# Patient Record
Sex: Female | Born: 1965 | Race: White | Hispanic: No | Smoking: Never smoker
Health system: Southern US, Community
[De-identification: ages and names within clinical notes are randomized; demographics above are authoritative.]

## PROBLEM LIST (undated history)

## (undated) DIAGNOSIS — F419 Anxiety disorder, unspecified: Secondary | ICD-10-CM

## (undated) DIAGNOSIS — N809 Endometriosis, unspecified: Secondary | ICD-10-CM

## (undated) DIAGNOSIS — H409 Unspecified glaucoma: Secondary | ICD-10-CM

## (undated) DIAGNOSIS — M2550 Pain in unspecified joint: Secondary | ICD-10-CM

## (undated) DIAGNOSIS — D649 Anemia, unspecified: Secondary | ICD-10-CM

## (undated) DIAGNOSIS — E785 Hyperlipidemia, unspecified: Secondary | ICD-10-CM

## (undated) DIAGNOSIS — R0602 Shortness of breath: Secondary | ICD-10-CM

## (undated) DIAGNOSIS — M549 Dorsalgia, unspecified: Secondary | ICD-10-CM

## (undated) DIAGNOSIS — I10 Essential (primary) hypertension: Secondary | ICD-10-CM

## (undated) DIAGNOSIS — N946 Dysmenorrhea, unspecified: Secondary | ICD-10-CM

## (undated) DIAGNOSIS — R7303 Prediabetes: Secondary | ICD-10-CM

## (undated) DIAGNOSIS — F32A Depression, unspecified: Secondary | ICD-10-CM

## (undated) DIAGNOSIS — K219 Gastro-esophageal reflux disease without esophagitis: Secondary | ICD-10-CM

## (undated) DIAGNOSIS — F329 Major depressive disorder, single episode, unspecified: Secondary | ICD-10-CM

## (undated) DIAGNOSIS — K5900 Constipation, unspecified: Secondary | ICD-10-CM

## (undated) DIAGNOSIS — Z789 Other specified health status: Secondary | ICD-10-CM

## (undated) DIAGNOSIS — G4730 Sleep apnea, unspecified: Secondary | ICD-10-CM

## (undated) DIAGNOSIS — T7840XA Allergy, unspecified, initial encounter: Secondary | ICD-10-CM

## (undated) HISTORY — DX: Hyperlipidemia, unspecified: E78.5

## (undated) HISTORY — DX: Other specified health status: Z78.9

## (undated) HISTORY — DX: Depression, unspecified: F32.A

## (undated) HISTORY — DX: Dysmenorrhea, unspecified: N94.6

## (undated) HISTORY — DX: Prediabetes: R73.03

## (undated) HISTORY — DX: Essential (primary) hypertension: I10

## (undated) HISTORY — DX: Endometriosis, unspecified: N80.9

## (undated) HISTORY — PX: POLYPECTOMY: SHX149

## (undated) HISTORY — DX: Pain in unspecified joint: M25.50

## (undated) HISTORY — DX: Major depressive disorder, single episode, unspecified: F32.9

## (undated) HISTORY — DX: Anemia, unspecified: D64.9

## (undated) HISTORY — DX: Unspecified glaucoma: H40.9

## (undated) HISTORY — PX: DENTAL SURGERY: SHX609

## (undated) HISTORY — DX: Sleep apnea, unspecified: G47.30

## (undated) HISTORY — DX: Anxiety disorder, unspecified: F41.9

## (undated) HISTORY — DX: Dorsalgia, unspecified: M54.9

## (undated) HISTORY — DX: Allergy, unspecified, initial encounter: T78.40XA

## (undated) HISTORY — DX: Constipation, unspecified: K59.00

## (undated) HISTORY — PX: DILATION AND CURETTAGE OF UTERUS: SHX78

## (undated) HISTORY — PX: COLONOSCOPY: SHX174

---

## 1999-09-25 ENCOUNTER — Other Ambulatory Visit: Admission: RE | Admit: 1999-09-25 | Discharge: 1999-09-25 | Payer: Self-pay | Admitting: Family Medicine

## 2004-03-23 ENCOUNTER — Ambulatory Visit (HOSPITAL_COMMUNITY): Admission: RE | Admit: 2004-03-23 | Discharge: 2004-03-23 | Payer: Self-pay | Admitting: Family Medicine

## 2004-04-03 ENCOUNTER — Other Ambulatory Visit: Admission: RE | Admit: 2004-04-03 | Discharge: 2004-04-03 | Payer: Self-pay | Admitting: Family Medicine

## 2006-02-12 ENCOUNTER — Other Ambulatory Visit: Admission: RE | Admit: 2006-02-12 | Discharge: 2006-02-12 | Payer: Self-pay | Admitting: Gynecology

## 2007-12-18 ENCOUNTER — Other Ambulatory Visit: Admission: RE | Admit: 2007-12-18 | Discharge: 2007-12-18 | Payer: Self-pay | Admitting: Obstetrics and Gynecology

## 2008-02-15 ENCOUNTER — Encounter: Payer: Self-pay | Admitting: Obstetrics and Gynecology

## 2008-02-15 ENCOUNTER — Ambulatory Visit (HOSPITAL_BASED_OUTPATIENT_CLINIC_OR_DEPARTMENT_OTHER): Admission: RE | Admit: 2008-02-15 | Discharge: 2008-02-15 | Payer: Self-pay | Admitting: Obstetrics and Gynecology

## 2010-11-13 NOTE — Op Note (Signed)
NAME:  Ariana Juarez, Ariana Juarez                 ACCOUNT NO.:  [id number]   MEDICAL RECORD NO.:  [id number]          PATIENT TYPE:  AMB   LOCATION:  [facility]                         FACILITY:  [facility]   PHYSICIAN:  Cynthia P. Romine, M.D.DATE OF BIRTH:  1966-01-16   DATE OF PROCEDURE:  02/15/2008  DATE OF DISCHARGE:                               OPERATIVE REPORT   PREOPERATIVE DIAGNOSIS:  Metrorrhagia and 12 mm fundal polyp.   POSTOPERATIVE DIAGNOSIS:  Metrorrhagia and 12 mm fundal polyp, path  pending.   PROCEDURE:  Hysteroscopic resection of polyp and D&C   SURGEON:  Cynthia P. Romine, M.D.   ANESTHESIA:  General by LMA.   ESTIMATED BLOOD LOSS:  Minimal.   SORBITOL DEFICIT:  35 mL.   COMPLICATIONS:  None.   PROCEDURE:  The patient was taken to the operating room and after the  induction of adequate general anesthesia, was placed in dorsal lithotomy  position and prepped and draped in the usual fashion.  Bladder was  drained with a red rubber catheter posterior weighted and anterior Sims  retractors were placed.  The cervix was grasped on its anterior lip with  a single-tooth tenaculum.  Paracervical block was instituted using 10 mL  of 1% Xylocaine in each of 3 and 9 o'clock.  The uterus sounded to 9 cm.  The cervix was dilated to a #31 Pratt.  The operative hysteroscope was  introduced.  Sorbitol was used as a distention medium.  The pump was set  on 70 mmHg initially and about half way through the case, it was  increased to 80 mmHg.  The hysteroscope was introduced.  There was a 1  cm sessile endometrial polyp in the fundus near the right cornu that was  resected with the single loop cautery.  There were some other areas of  polyp-like structures in the endometrium that were removed with a loop.  The hysteroscope was then withdrawn.  Gentle sharp curettage was then  done.  Specimens were sent as one to pathology.  The instruments were  removed from vagina and the procedure was terminated.   The patient  tolerated it well, in satisfactory condition to post anesthesia  recovery.      Cynthia P. Romine, M.D.  Electronically Signed     CPR/MEDQ  D:  02/15/2008  T:  02/15/2008  Job:  [id number]

## 2011-03-29 LAB — BASIC METABOLIC PANEL
BUN: 10
CO2: 28
Calcium: 9.3
Chloride: 97
Creatinine, Ser: 0.71
GFR calc Af Amer: >60
GFR calc non Af Amer: >60
Glucose, Bld: 90
Potassium: 3.5
Sodium: 136

## 2011-03-29 LAB — CBC
HCT: 42
RBC: 4.91
RDW: 14.4
WBC: 12.8 — ABNORMAL HIGH

## 2011-03-29 LAB — HCG, SERUM, QUALITATIVE: Preg, Serum: NEGATIVE

## 2011-10-22 ENCOUNTER — Ambulatory Visit: Payer: Self-pay | Admitting: Family Medicine

## 2011-10-22 VITALS — BP 123/72 | HR 80 | Temp 98.3°F | Resp 16 | Ht 64.0 in | Wt 200.6 lb

## 2011-10-22 DIAGNOSIS — R1032 Left lower quadrant pain: Secondary | ICD-10-CM

## 2011-10-22 LAB — POCT CBC
Hemoglobin: 10.6 g/dL — AB (ref 12.2–16.2)
Lymph, poc: 3.4 (ref 0.6–3.4)
MCV: 73.9 fL — AB (ref 80–97)
MID (cbc): 0.9 (ref 0–0.9)
POC Granulocyte: 9.6 — AB (ref 2–6.9)
POC LYMPH PERCENT: 24.6 %L (ref 10–50)
Platelet Count, POC: 438 10*3/uL — AB (ref 142–424)
RBC: 4.62 M/uL (ref 4.04–5.48)
RDW, POC: 17.7 %
WBC: 13.9 10*3/uL — AB (ref 4.6–10.2)

## 2011-10-22 LAB — BASIC METABOLIC PANEL
BUN: 11 mg/dL (ref 6–23)
CO2: 26 mEq/L (ref 19–32)
Glucose, Bld: 74 mg/dL (ref 70–99)
Potassium: 3.5 mEq/L (ref 3.5–5.3)

## 2011-10-22 MED ORDER — MELOXICAM 7.5 MG PO TABS: ORAL_TABLET | ORAL | Status: DC

## 2011-10-22 NOTE — Progress Notes (Signed)
Patient Name: Ariana Juarez Date of Birth: Sep 15, 1965 Medical Record Number: [id number] Gender: female Date of Encounter: 10/22/2011  History of Present Illness:  Ariana Juarez is a 46 y.o. very pleasant female patient who presents with the following:  Here to discuss possible ovarian cysts.  She notes that she has a Heeter history of "bad cysts" once or twice a year.  However for about the last year she has noted pain in her LLQ about once a month.  She also notes that she "feels faint" when she has her menses every month and "basically just has to stay in bed."  She currently states that she has pain "from my uterus to my tailbone."   She has noted pain this time since Friday- today is Tuesday.  Has been taking some of her brother's pain medication.  She notes that she has had to leave work and "just stay in bed" with these symptoms and also with her periods.  Her menses occur about every 3 1/2 weeks.    Bridgitt states that she last saw her OB and had an ultrasound for this condition about 3 years ago.  She also states that she has taken pills "which are like a D and C but not as strong" for this in the past (?)  She has not had any fever, dysuria, nausea or vomiting.   She has not been SA since "the 1980's" and states there is absolutely no chance of pregnancy.  LMP 10/09/11.  No vaginal discharge  Upon review of Ariana Juarez's chart it appears that she has been evaluated for this problem a few times- a note from 2005 describe problems with LLQ pain for 3 years.   There is no problem list on file for this patient.  No past medical history on file. No past surgical history on file. History  Substance Use Topics  . Smoking status: Never Smoker   . Smokeless tobacco: Not on file  . Alcohol Use: Not on file   No family history on file. No Known Allergies  Medication list has been reviewed and updated.  Review of Systems: As per HPI- otherwise negative. Difficult historian   Physical  Examination: Filed Vitals:   10/22/11 1247  BP: 123/72  Pulse: 80  Temp: 98.3 F (36.8 C)  TempSrc: Oral  Resp: 16  Height: 5\' 4"  (1.626 m)  Weight: 200 lb 9.6 oz (90.992 kg)    Body mass index is 34.43 kg/(m^2).  As per HPI- otherwise negative. "I'm afraid there is infection through my body because I have a tooth infection."   Assessment and Plan: GEN: WDWN, NAD, Non-toxic, A & O x 3, obese HEENT: Atraumatic, Normocephalic. Neck supple. No masses, No LAD. Ears and Nose: No external deformity. CV: RRR, No M/G/R. No JVD. No thrill. No extra heart sounds. PULM: CTA B, no wheezes, crackles, rhonchi. No retractions. No resp. distress. No accessory muscle use. ABD: S, NT, ND, +BS. No rebound. No HSM. Gu: normal external genitals, normal vagina and cervix/ no CMT.  Negative adnexa EXTR: No c/c/e NEURO Normal gait.  PSYCH: Normally interactive. Conversant. Not depressed or anxious appearing.  Calm demeanor.   Results for orders placed in visit on 10/22/11  POCT CBC      Component Value Range   WBC 13.9 (*) 4.6 - 10.2 (K/uL)   Lymph, poc 3.4  0.6 - 3.4    POC LYMPH PERCENT 24.6  10 - 50 (%L)   MID (cbc) 0.9  0 - 0.9  POC MID % 6.4  0 - 12 (%M)   POC Granulocyte 9.6 (*) 2 - 6.9    Granulocyte percent 69.0  37 - 80 (%G)   RBC 4.62  4.04 - 5.48 (M/uL)   Hemoglobin 10.6 (*) 12.2 - 16.2 (g/dL)   HCT, POC 16.1 (*) 09.6 - 47.9 (%)   MCV 73.9 (*) 80 - 97 (fL)   MCH, POC 22.9 (*) 27 - 31.2 (pg)   MCHC 31.1 (*) 31.8 - 35.4 (g/dL)   RDW, POC 04.5     Platelet Count, POC 438 (*) 142 - 424 (K/uL)   MPV 9.6  0 - 99.8 (fL)   Assessment/ plan 1. Abdominal pain, left lower quadrant  Pap IG (Image Guided), POCT CBC, Basic metabolic panel, meloxicam (MOBIC) 7.5 MG tablet   Xavier has had problems with LLQ pain/ tenderness off an on for several years which has been attributed to ovarian cysts.    She currently does not have insurance and does not want to go for an ultrasound/ OBG  consultation.  At this time her exam is benign.  She also requested a pap and BMP today so these were performed.  I was concerned about her elevated wbc count, but on review of her chart it seems that her WBC count is usually at least 12k, sometimes 13k without any known cause.  This has been the case for several years.  Therefore, do not think we urgently need to do a CT or other invasive evaluation.    I did encourage her to start an OTC iron supplement as she likely has microcytic anemia.  She had not wanted to do this because her "mother told me that iron makes cancer grow."  I let her know that this is not true and that iron might help improve her energy level.  For the time being will await her other labs and follow- up closely.  If she gets worse or her symptoms change in the meantime please call or RTC/ ED. Mobic prn

## 2011-10-24 LAB — PAP IG (IMAGE GUIDED)

## 2011-10-28 ENCOUNTER — Encounter: Payer: Self-pay | Admitting: Family Medicine

## 2011-10-29 ENCOUNTER — Telehealth: Payer: Self-pay

## 2011-10-29 NOTE — Telephone Encounter (Signed)
Pt CB and LM on my VM w/status update for Dr Patsy Lager who had called and LM to check on pt. Pt states that she is "doing OK so far" and that she has been able to take the Meloxicm w/no SEs so far. She thanked Dr Patsy Lager for checking on her.

## 2011-11-04 ENCOUNTER — Encounter: Payer: Self-pay | Admitting: Family Medicine

## 2011-11-06 ENCOUNTER — Telehealth: Payer: Self-pay

## 2011-11-06 NOTE — Telephone Encounter (Addendum)
Pt is having a lot of pain with her menstrual cycle would like a nurse to call her regarding this

## 2011-11-07 NOTE — Telephone Encounter (Signed)
Pain has subsided.  She took an oxycontin and 1/2 of an hydrocodone.  She asked if she should go to er to get morphine.  I told her she would probably need to see her gynecologist.

## 2011-12-19 ENCOUNTER — Ambulatory Visit: Payer: Self-pay | Admitting: Physician Assistant

## 2011-12-19 VITALS — BP 141/82 | HR 109 | Temp 98.7°F | Resp 18 | Ht 64.0 in | Wt 204.2 lb

## 2011-12-19 DIAGNOSIS — I10 Essential (primary) hypertension: Secondary | ICD-10-CM

## 2011-12-19 DIAGNOSIS — E7800 Pure hypercholesterolemia, unspecified: Secondary | ICD-10-CM

## 2011-12-19 DIAGNOSIS — J209 Acute bronchitis, unspecified: Secondary | ICD-10-CM

## 2011-12-19 MED ORDER — PREDNISONE 20 MG PO TABS: ORAL_TABLET | ORAL | Status: DC

## 2011-12-19 MED ORDER — BENZONATATE 100 MG PO CAPS: 100.0000 mg | ORAL_CAPSULE | Freq: Three times a day (TID) | ORAL | Status: AC | PRN

## 2011-12-19 MED ORDER — AZITHROMYCIN 500 MG PO TABS: 500.0000 mg | ORAL_TABLET | Freq: Every day | ORAL | Status: AC

## 2011-12-19 NOTE — Progress Notes (Signed)
  Subjective:    Patient ID: Ariana Juarez, female    DOB: 28-Aug-1965, 46 y.o.   MRN: [id number]  HPI 46yo CF presents with 1 week h/o URI that has progressively worsened over the last day or two.  No f/c.  +cough. +sinus congestion.  She still has dulera at home but not using it.    Reviewed 09/2011 labs and glucose was normal  Review of Systems  All other systems reviewed and are negative.       Objective:   Physical Exam  Nursing note and vitals reviewed. Constitutional: She is oriented to person, place, and time. She appears well-developed and well-nourished.  HENT:  Head: Normocephalic and atraumatic.  Mouth/Throat: Oropharynx is clear and moist. No oropharyngeal exudate (PND without erythema).       B TM with fluid present, but no infection.  Turbinates inflamed and swollen.  Neck: Normal range of motion. Neck supple.  Cardiovascular: Normal rate, regular rhythm and normal heart sounds.   Pulmonary/Chest: Effort normal. No respiratory distress. She has wheezes (mild wheezes present B at end expiration.). She has no rales. She exhibits no tenderness.  Lymphadenopathy:    She has no cervical adenopathy.  Neurological: She is alert and oriented to person, place, and time.  Skin: Skin is warm and dry.          Assessment & Plan:  Asthmatic bronchitis-see Rxs.  Restart Dulera.

## 2011-12-19 NOTE — Patient Instructions (Addendum)
Fluids and rest.  Use dulera inhaler as directed previously for the next 2 weeks.

## 2012-01-06 ENCOUNTER — Ambulatory Visit: Payer: Self-pay | Admitting: Family Medicine

## 2012-01-06 ENCOUNTER — Ambulatory Visit: Payer: Self-pay

## 2012-01-06 ENCOUNTER — Encounter: Payer: Self-pay | Admitting: Family Medicine

## 2012-01-06 VITALS — BP 118/90 | HR 107 | Temp 98.3°F | Resp 20 | Ht 64.0 in | Wt 206.8 lb

## 2012-01-06 DIAGNOSIS — R059 Cough, unspecified: Secondary | ICD-10-CM

## 2012-01-06 DIAGNOSIS — R05 Cough: Secondary | ICD-10-CM

## 2012-01-06 NOTE — Progress Notes (Signed)
Patient Name: Ariana Juarez Date of Birth: [birth year] Medical Record Number: [id number] Gender: female Date of Encounter: 01/06/2012  Chief Complaint: Cough   History of Present Illness:  Ariana Juarez is a 46 y.o. very pleasant female patient who presents with the following:  She was here on 12/19/11 and was started on a z-pack and prednisone for asthmatic bronchitis. She finished these medications and felt a little better.  However, she continues to have a cough which is "deep in my chest."   She has not noted a fever.  She does have some hot flashes.  She is still using her dulera (she has this from a previous episode of pneumonia).   LMP 12/23/11- she is not SA.    Ariana Juarez feels that "something is wearing me down" and causing her to become ill frequently.  She is taking her iron sometimes (see CBC here 10/22/11).    Patient Active Problem List  Diagnosis  . HTN (hypertension)  . Hypercholesterolemia   No past medical history on file. No past surgical history on file. History  Substance Use Topics  . Smoking status: Never Smoker   . Smokeless tobacco: Not on file  . Alcohol Use: Not on file   No family history on file. Allergies  Allergen Reactions  . Mobic (Meloxicam)     Patient states she "felt her heart beat" while on med    Medication list has been reviewed and updated.  Current Outpatient Prescriptions on File Prior to Visit  Medication Sig Dispense Refill  . lisinopril-hydrochlorothiazide (PRINZIDE,ZESTORETIC) 20-25 MG per tablet Take 1 tablet by mouth daily.      . mometasone-formoterol (DULERA) 100-5 MCG/ACT AERO Inhale 2 puffs into the lungs 2 (two) times daily.      . pravastatin (PRAVACHOL) 40 MG tablet Take 40 mg by mouth daily.      . sertraline (ZOLOFT) 50 MG tablet Take 50 mg by mouth daily.       . predniSONE (DELTASONE) 20 MG tablet 3,3,3,2,2,2,1,1,1 Take each days dose in am with food  18 tablet  0   Review of Systems:  As per HPI- otherwise  negative. Feels tired  Physical Examination: Filed Vitals:   01/06/12 1653  BP: 160/90  Pulse: 107  Temp: 98.3 F (36.8 C)  Resp: 20   Filed Vitals:   01/06/12 1653  Height: 5\' 4"  (1.626 m)  Weight: 206 lb 12.8 oz (93.804 kg)   Body mass index is 35.50 kg/(m^2). Ideal Body Weight: Weight in (lb) to have BMI = 25: 145.3   GEN: WDWN, NAD, Non-toxic, A & O x 3, obese HEENT: Atraumatic, Normocephalic. Neck supple. No masses, No LAD.  TM and oropharynx wnl.  PEERL, EOMI Ears and Nose: No external deformity. CV: RRR, No M/G/R. No JVD. No thrill. No extra heart sounds. PULM: CTA B, no wheezes, crackles, rhonchi. No retractions. No resp. distress. No accessory muscle use. ABD: S, NT, ND, +BS. No rebound. No HSM. EXTR: No c/c/e NEURO Normal gait.  PSYCH: Normally interactive. Conversant. Not depressed or anxious appearing.  Calm demeanor.   [facility] reading (PRIMARY) by  Dr. Patsy Lager. Negative chest  CHEST - 2 VIEW  Comparison: None.  Findings: The lungs are clear. There is some peribronchial thickening which may indicate bronchitis. The heart is within normal limits in size. No bony abnormality is seen.  IMPRESSION: No pneumonia. Peribronchial thickening may indicate bronchitis.  Assessment and Plan: 1. Cough  DG Chest 2 View   Persistent cough after  a recent illness.  However, it seems that she is feeling better now.  Will continue conservative treatment.  Also encouraged her to start exercising/ eating healthfully with a goal of losing some weight and getting into better shape.  She declined to recheck her CBC today- I asked her to please come in within the next couple of weeks so we can recheck her CBC and see how she is doing with her anemia.   Received radiology note- possible bronchitis.  Will call tomorrow and offer doxycycline- however she was recently treated with azithromycin so if overall better may not need more abx.   Abbe Amsterdam, MD

## 2012-01-07 ENCOUNTER — Other Ambulatory Visit: Payer: Self-pay | Admitting: Family Medicine

## 2012-01-07 DIAGNOSIS — J40 Bronchitis, not specified as acute or chronic: Secondary | ICD-10-CM

## 2012-01-07 MED ORDER — DOXYCYCLINE HYCLATE 100 MG PO TABS: 100.0000 mg | ORAL_TABLET | Freq: Two times a day (BID) | ORAL | Status: AC

## 2012-01-07 NOTE — Progress Notes (Signed)
Called and LMOM- reviewed her chest XR report from yesterday.  She may have bronchitis. I will send in rx for doxycycline which she can use if she likes.  However, she can also see if she does not improve on her own in the next few days.  I will call and check on her soon

## 2012-01-09 ENCOUNTER — Telehealth: Payer: Self-pay | Admitting: Family Medicine

## 2012-01-09 NOTE — Telephone Encounter (Signed)
Still no answer- LMOM

## 2012-01-10 ENCOUNTER — Encounter: Payer: Self-pay | Admitting: Family Medicine

## 2012-01-10 ENCOUNTER — Telehealth: Payer: Self-pay | Admitting: Family Medicine

## 2012-01-10 NOTE — Telephone Encounter (Signed)
LMOM again- this is our third or fourth message.  LM saying that if she needs anything please call, but otherwise I will assume that she is feeling better.  Will mail her a letter also with copy of x-ray results.

## 2012-01-13 ENCOUNTER — Telehealth: Payer: Self-pay

## 2012-01-13 NOTE — Telephone Encounter (Signed)
Pt is on day 5 of antibiotics and is concerned she does not feel better. She is wheezing and now her right ear is clogged up. Pt is wondering if she should be taking anything else. [phone] Uses [facility] on [address]

## 2012-01-14 NOTE — Telephone Encounter (Signed)
Advised Ariana Juarez to use Mucinex 1200 mg BID and drink plenty of water. Ariana Juarez stated that she is doing a little bit better and has about 3 1/2 days left of Doxy. Advised Ariana Juarez to finish Doxy and if Sxs persist to RTC, or sooner if worsens. Reminded Ariana Juarez to also RTC for CBC to f/up on anemia per Dr Patsy Lager. Ariana Juarez agreed.

## 2012-01-14 NOTE — Telephone Encounter (Signed)
Can take OTC Mucinex. If still no better may need to RTC

## 2012-03-08 ENCOUNTER — Other Ambulatory Visit: Payer: Self-pay | Admitting: Family Medicine

## 2012-06-18 ENCOUNTER — Telehealth: Payer: Self-pay | Admitting: *Deleted

## 2012-06-18 MED ORDER — SERTRALINE HCL 50 MG PO TABS: 50.0000 mg | ORAL_TABLET | Freq: Every day | ORAL | Status: DC

## 2012-06-18 NOTE — Telephone Encounter (Signed)
Called patient, to advise she is due for office visit before current Rx runs out.

## 2012-06-18 NOTE — Telephone Encounter (Signed)
I have sent zoloft to the pharmacy.  Pt needs an OV before this runs out, this medication was last discussed at an OV over 1 yr ago

## 2012-06-18 NOTE — Telephone Encounter (Signed)
Pharmacy requesting refill on zoloft 50mg . Last refill on 11/11/11 90 day supply.  Chart is nurses station for review [place] [id number].

## 2012-08-27 ENCOUNTER — Encounter: Payer: Self-pay | Admitting: Physician Assistant

## 2012-08-27 ENCOUNTER — Ambulatory Visit (INDEPENDENT_AMBULATORY_CARE_PROVIDER_SITE_OTHER): Payer: Managed Care, Other (non HMO) | Admitting: Physician Assistant

## 2012-08-27 VITALS — BP 138/60 | HR 81 | Temp 97.9°F | Resp 16 | Ht 64.0 in | Wt 210.0 lb

## 2012-08-27 DIAGNOSIS — E7800 Pure hypercholesterolemia, unspecified: Secondary | ICD-10-CM

## 2012-08-27 DIAGNOSIS — F329 Major depressive disorder, single episode, unspecified: Secondary | ICD-10-CM

## 2012-08-27 DIAGNOSIS — I10 Essential (primary) hypertension: Secondary | ICD-10-CM

## 2012-08-27 DIAGNOSIS — F32A Depression, unspecified: Secondary | ICD-10-CM

## 2012-08-27 DIAGNOSIS — Z0000 Encounter for general adult medical examination without abnormal findings: Secondary | ICD-10-CM

## 2012-08-27 DIAGNOSIS — N946 Dysmenorrhea, unspecified: Secondary | ICD-10-CM | POA: Insufficient documentation

## 2012-08-27 MED ORDER — PRAVASTATIN SODIUM 40 MG PO TABS: 40.0000 mg | ORAL_TABLET | Freq: Every day | ORAL | Status: DC

## 2012-08-27 MED ORDER — LISINOPRIL-HYDROCHLOROTHIAZIDE 20-25 MG PO TABS: 1.0000 | ORAL_TABLET | Freq: Every day | ORAL | Status: DC

## 2012-08-27 MED ORDER — SERTRALINE HCL 50 MG PO TABS: 50.0000 mg | ORAL_TABLET | Freq: Every day | ORAL | Status: DC

## 2012-08-27 NOTE — Progress Notes (Signed)
Subjective:    Patient ID: Ariana Juarez, female    DOB: [birth year], 47 y.o.   MRN: [id number]  HPI This 47 y.o. female presents for medication refills.  Her insurance requires a complete physical annually for reduced premiums.  She has breast and GYN care at a local OBGYN practice and has an appointment scheduled for next month, at which she hopes to discuss uterine ablation to address heavy menses and dysmenorrhea.     Past Medical History  Diagnosis Date  . Hypertension   . Hyperlipidemia   . Depression   . Dysmenorrhea   . Anemia   . Glaucoma     Past Surgical History  Procedure Laterality Date  . Dilation and curettage of uterus    . Dental surgery      bone grafting    Prior to Admission medications   Medication Sig Start Date End Date Taking? Authorizing Provider  Ascorbic Acid (VITAMIN C PO) Take by mouth.   Yes Historical Provider, MD  Flaxseed, Linseed, (BL FLAX SEED OIL PO) Take by mouth.   Yes Historical Provider, MD  IRON PO Take by mouth.   Yes Historical Provider, MD  lisinopril-hydrochlorothiazide (PRINZIDE,ZESTORETIC) 20-25 MG per tablet TAKE ONE TABLET BY MOUTH ONE TIME DAILY 03/08/12  Yes Heather M Marte, PA-C  LUTEIN PO Take by mouth.   Yes Historical Provider, MD  Nutritional Supplements (NUTRITIONAL SUPPLEMENT PO) Take 2 oz by mouth daily. VEMMA   Yes Historical Provider, MD  pravastatin (PRAVACHOL) 40 MG tablet Take 40 mg by mouth daily.   Yes Historical Provider, MD  sertraline (ZOLOFT) 50 MG tablet Take 1 tablet (50 mg total) by mouth daily. 06/18/12  Yes Godfrey Pick, PA-C    Allergies  Allergen Reactions  . Mobic (Meloxicam)     Patient states she "felt her heart beat" while on med    History   Social History  . Marital Status: Single    Spouse Name: n/a    Number of Children: 0  . Years of Education: 12   Occupational History  . training assistant [employer]   Social History Main Topics  . Smoking status: Never Smoker   . Smokeless tobacco:  Never Used  . Alcohol Use: Yes     Comment: one drink every 2 years  . Drug Use: No     Comment: oxycontin, from a friend  . Sexually Active: Not Currently -- Female partner(s)   Other Topics Concern  . Not on file   Social History Narrative   Lives with her mother and brother, and her maternal uncle.    Family History  Problem Relation Age of Onset  . Cancer Mother 79    uterine  . Cancer Father     lung/liver  . Spina bifida Brother     mild  . Crohn's disease Brother 82  . Mental retardation Brother     bipolar disorder     Review of Systems  Constitutional: Negative.   HENT: Negative.   Eyes: Negative.   Respiratory: Negative.   Cardiovascular: Negative.   Gastrointestinal: Negative.   Genitourinary: Positive for menstrual problem.  Musculoskeletal: Negative.   Skin: Negative.   Neurological: Negative.   Psychiatric/Behavioral: Positive for dysphoric mood (controlled). Negative for suicidal ideas, hallucinations, sleep disturbance and self-injury. The patient is not nervous/anxious.        Objective:   Physical Exam  Vitals reviewed. Constitutional: She is oriented to person, place, and time. Vital signs are normal. She  appears well-developed and well-nourished. No distress.  HENT:  Head: Normocephalic and atraumatic.  Right Ear: Hearing, tympanic membrane, external ear and ear canal normal. No foreign bodies.  Left Ear: Hearing, tympanic membrane, external ear and ear canal normal. No foreign bodies.  Nose: Nose normal.  Mouth/Throat: Uvula is midline, oropharynx is clear and moist and mucous membranes are normal. No oral lesions. Normal dentition. No dental abscesses or edematous. No oropharyngeal exudate.  Eyes: Conjunctivae and EOM are normal. Pupils are equal, round, and reactive to light. Right eye exhibits no discharge. Left eye exhibits no discharge. No scleral icterus.  Fundoscopic exam:      The right eye shows no arteriolar narrowing, no AV nicking,  no exudate, no hemorrhage and no papilledema. The right eye shows red reflex. The right eye shows no venous pulsations.       The left eye shows no arteriolar narrowing, no AV nicking, no exudate, no hemorrhage and no papilledema. The left eye shows red reflex. The left eye shows no venous pulsations.  Neck: Trachea normal, normal range of motion and full passive range of motion without pain. Neck supple. No spinous process tenderness and no muscular tenderness present. No mass and no thyromegaly present.  Cardiovascular: Normal rate, regular rhythm, normal heart sounds, intact distal pulses and normal pulses.   Pulmonary/Chest: Effort normal and breath sounds normal.  Genitourinary: Rectum normal and vagina normal.  Musculoskeletal: She exhibits no edema and no tenderness.       Cervical back: Normal.       Thoracic back: Normal.       Lumbar back: Normal.  Lymphadenopathy:       Head (right side): No tonsillar, no preauricular, no posterior auricular and no occipital adenopathy present.       Head (left side): No tonsillar, no preauricular, no posterior auricular and no occipital adenopathy present.    She has no cervical adenopathy.    She has no axillary adenopathy.       Right: No supraclavicular adenopathy present.       Left: No supraclavicular adenopathy present.  Neurological: She is alert and oriented to person, place, and time. She has normal strength and normal reflexes. No cranial nerve deficit. She exhibits normal muscle tone. Coordination and gait normal.  Skin: Skin is warm, dry and intact. No rash noted. She is not diaphoretic. No cyanosis or erythema. Nails show no clubbing.  Psychiatric: She has a normal mood and affect. Her speech is normal and behavior is normal. Judgment and thought content normal.   No labs at this visit, per her request.       Assessment & Plan:  Routine general medical examination at a health care facility  Depression - Plan: sertraline (ZOLOFT)  50 MG tablet  HTN (hypertension) - Plan: lisinopril-hydrochlorothiazide (PRINZIDE,ZESTORETIC) 20-25 MG per tablet  Hypercholesterolemia - Plan: pravastatin (PRAVACHOL) 40 MG tablet   Age appropriate anticipatory guidance provided. Rx written for desired labs: CBC with differential, CMET, Lipids, TSH; to be drawn at work. Proceed with GYN evaluation. She'll check the status of her tetanus vaccination.

## 2012-08-27 NOTE — Patient Instructions (Signed)
Please have the labs drawn at work, and the results sent to me for review. Please check the status of your tetanus vaccine.  If it has been more than 10 years, you need a booster. Check with your insurance-they may cover some services, separate from your deductible.  Keeping You Healthy  Get These Tests 1. Blood Pressure- Have your blood pressure checked once a year by your health care provider.  Normal blood pressure is 120/80. 2. Weight- Have your body mass index (BMI) calculated to screen for obesity.  BMI is measure of body fat based on height and weight.  You can also calculate your own BMI at https://www.west-esparza.com/. 3. Cholesterol- Have your cholesterol checked every 5 years starting at age 54 then yearly starting at age 72. 4. Chlamydia, HIV, and other sexually transmitted diseases- Get screened every year until age 65, then within three months of each new sexual provider. 5. Pap Smear- Every 1-3 years; discuss with your health care provider. 6. Mammogram- Every year starting at age 17  Take these medicines  Calcium with Vitamin D-Your body needs 1200 mg of Calcium each day and [phone] IU of Vitamin D daily.  Your body can only absorb 500 mg of Calcium at a time so Calcium must be taken in 2 or 3 divided doses throughout the day.  Multivitamin with folic acid- Once daily if it is possible for you to become pregnant.  Get these Immunizations  Gardasil-Series of three doses; prevents HPV related illness such as genital warts and cervical cancer.  Menactra-Single dose; prevents meningitis.  Tetanus shot- Every 10 years.  Flu shot-Every year.  Take these steps 1. Do not smoke-Your healthcare provider can help you quit.  For tips on how to quit go to www.smokefree.gov or call 1-800 QUITNOW. 2. Be physically active- Exercise 5 days a week for at least 30 minutes.  If you are not already physically active, start slow and gradually work up to 30 minutes of moderate physical  activity.  Examples of moderate activity include walking briskly, dancing, swimming, bicycling, etc. 3. Breast Cancer- A self breast exam every month is important for early detection of breast cancer.  For more information and instruction on self breast exams, ask your healthcare provider or [place].[employer]. 4. Eat a healthy diet- Eat a variety of healthy foods such as fruits, vegetables, whole grains, low fat milk, low fat cheeses, yogurt, lean meats, poultry and fish, beans, nuts, tofu, etc.  For more information go to www. Thenutritionsource.org 5. Drink alcohol in moderation- Limit alcohol intake to one drink or less per day. Never drink and drive. 6. Depression- Your emotional health is as important as your physical health.  If you're feeling down or losing interest in things you normally enjoy please talk to your healthcare provider about being screened for depression. 7. Dental visit- Brush and floss your teeth twice daily; visit your dentist twice a year. 8. Eye doctor- Get an eye exam at least every 2 years. 9. Helmet use- Always wear a helmet when riding a bicycle, motorcycle, rollerblading or skateboarding. 10. Safe sex- If you may be exposed to sexually transmitted infections, use a condom. 11. Seat belts- Seat belts can save your live; always wear one. 12. Smoke/Carbon Monoxide detectors- These detectors need to be installed on the appropriate level of your home. Replace batteries at least once a year. 13. Skin cancer- When out in the sun please cover up and use sunscreen 15 SPF or higher. 14. Violence- If anyone is  threatening or hurting you, please tell your healthcare provider.

## 2012-08-31 LAB — CBC AND DIFFERENTIAL
Platelets: 319 10*3/uL (ref 150–399)
WBC: 12 10^3/mL

## 2012-08-31 LAB — BASIC METABOLIC PANEL: Potassium: 3.8 mmol/L (ref 3.4–5.3)

## 2012-08-31 LAB — LIPID PANEL: HDL: 41 mg/dL (ref 35–70)

## 2012-08-31 LAB — HEPATIC FUNCTION PANEL: AST: 17 U/L (ref 13–35)

## 2012-09-05 ENCOUNTER — Encounter: Payer: Self-pay | Admitting: Physician Assistant

## 2012-10-13 ENCOUNTER — Other Ambulatory Visit: Payer: Self-pay | Admitting: Obstetrics and Gynecology

## 2012-10-22 ENCOUNTER — Encounter (HOSPITAL_COMMUNITY): Payer: Self-pay | Admitting: Pharmacist

## 2012-10-30 ENCOUNTER — Other Ambulatory Visit: Payer: Self-pay

## 2012-10-30 ENCOUNTER — Encounter (HOSPITAL_COMMUNITY)
Admission: RE | Admit: 2012-10-30 | Discharge: 2012-10-30 | Disposition: A | Discharge status: Home / Self Care | Payer: Managed Care, Other (non HMO) | Source: Ambulatory Visit | Attending: Obstetrics and Gynecology | Admitting: Obstetrics and Gynecology

## 2012-10-30 ENCOUNTER — Encounter (HOSPITAL_COMMUNITY): Payer: Self-pay

## 2012-10-30 HISTORY — DX: Shortness of breath: R06.02

## 2012-10-30 HISTORY — DX: Gastro-esophageal reflux disease without esophagitis: K21.9

## 2012-10-30 LAB — SURGICAL PCR SCREEN
MRSA, PCR: NEGATIVE
Staphylococcus aureus: NEGATIVE

## 2012-10-30 LAB — CBC
Hemoglobin: 12.2 g/dL (ref 12.0–15.0)
RBC: 4.77 MIL/uL (ref 3.87–5.11)
WBC: 12.6 10*3/uL — ABNORMAL HIGH (ref 4.0–10.5)

## 2012-10-30 LAB — BASIC METABOLIC PANEL
CO2: 31 mEq/L (ref 19–32)
GFR calc non Af Amer: >90 mL/min (ref >90)
Glucose, Bld: 100 mg/dL — ABNORMAL HIGH (ref 70–99)
Potassium: 3.7 mEq/L (ref 3.5–5.1)
Sodium: 139 mEq/L (ref 135–145)

## 2012-10-30 NOTE — Patient Instructions (Signed)
Your procedure is scheduled on:11/12/12  Enter through the Main Entrance at :6am Pick up desk phone and dial [phone] and inform us of your arrival.  Please call [phone] if you have any problems the morning of surgery.  Remember: Do not eat or drink after midnight:WED.   DO NOT wear jewelry, eye make-up, lipstick,body lotion, or dark fingernail polish.   Patients discharged on the day of surgery will not be allowed to drive home.

## 2012-11-09 NOTE — H&P (Addendum)
47 yo w/ LLQ pain, dysmenorrhea and menorrhagia presents for surgical mngt  PMHx:  HTN, hypercholesterolemia, anxiety/depression PSHx:  D&C, jaw surgery All:  mobic Meds;  Zoloft, lisinopril, pravastatin SHx:  Negative, jehovah's witness Fhx:  Mom - uterine ca  AF, VSS Gen - NAD CV - RRR L - clear Abd - soft, NT/ND PV - uterus mobile, tender LLQ  [place] - left hydrosalpinx Em Bx - benign  A/P:  LLQ  Pain w/ left hydrosalpinx, menorrhagia, dysmenorrhea Laparoscopic left salpingectomy Hysteroscopy, D&C, ablation Plan of care reviewed, informed consent

## 2012-11-11 MED ORDER — DEXTROSE 5 % IV SOLN
2.0000 g | INTRAVENOUS | Status: AC
  Administered 2012-11-12: 2 g via INTRAVENOUS
  Filled 2012-11-11: qty 2

## 2012-11-12 ENCOUNTER — Encounter (HOSPITAL_COMMUNITY): Payer: Self-pay | Admitting: Anesthesiology

## 2012-11-12 ENCOUNTER — Ambulatory Visit (HOSPITAL_COMMUNITY)
Admission: RE | Admit: 2012-11-12 | Discharge: 2012-11-12 | Disposition: A | Discharge status: Home / Self Care | Payer: Managed Care, Other (non HMO) | Source: Ambulatory Visit | Attending: Obstetrics and Gynecology | Admitting: Obstetrics and Gynecology

## 2012-11-12 ENCOUNTER — Encounter (HOSPITAL_COMMUNITY)
Admission: RE | Disposition: A | Discharge status: Home / Self Care | Payer: Self-pay | Source: Ambulatory Visit | Attending: Obstetrics and Gynecology

## 2012-11-12 ENCOUNTER — Ambulatory Visit (HOSPITAL_COMMUNITY): Payer: Managed Care, Other (non HMO) | Admitting: Anesthesiology

## 2012-11-12 ENCOUNTER — Encounter (HOSPITAL_COMMUNITY): Payer: Self-pay | Admitting: *Deleted

## 2012-11-12 DIAGNOSIS — N920 Excessive and frequent menstruation with regular cycle: Secondary | ICD-10-CM | POA: Insufficient documentation

## 2012-11-12 DIAGNOSIS — N803 Endometriosis of pelvic peritoneum, unspecified: Secondary | ICD-10-CM | POA: Insufficient documentation

## 2012-11-12 DIAGNOSIS — N736 Female pelvic peritoneal adhesions (postinfective): Secondary | ICD-10-CM | POA: Insufficient documentation

## 2012-11-12 DIAGNOSIS — N7013 Chronic salpingitis and oophoritis: Secondary | ICD-10-CM | POA: Insufficient documentation

## 2012-11-12 DIAGNOSIS — Z01818 Encounter for other preprocedural examination: Secondary | ICD-10-CM | POA: Insufficient documentation

## 2012-11-12 DIAGNOSIS — Z01812 Encounter for preprocedural laboratory examination: Secondary | ICD-10-CM | POA: Insufficient documentation

## 2012-11-12 HISTORY — PX: LAPAROSCOPY: SHX197

## 2012-11-12 SURGERY — LAPAROSCOPY, DIAGNOSTIC
Anesthesia: General | Site: Abdomen | Wound class: Clean Contaminated

## 2012-11-12 MED ORDER — PROPOFOL 10 MG/ML IV EMUL
INTRAVENOUS | Status: AC
  Filled 2012-11-12: qty 20

## 2012-11-12 MED ORDER — ONDANSETRON HCL 4 MG/2ML IJ SOLN
INTRAMUSCULAR | Status: AC
  Filled 2012-11-12: qty 2

## 2012-11-12 MED ORDER — ROCURONIUM BROMIDE 50 MG/5ML IV SOLN
INTRAVENOUS | Status: AC
  Filled 2012-11-12: qty 1

## 2012-11-12 MED ORDER — EPHEDRINE SULFATE 50 MG/ML IJ SOLN
INTRAMUSCULAR | Status: DC | PRN
  Administered 2012-11-12: 5 mg via INTRAVENOUS

## 2012-11-12 MED ORDER — ROCURONIUM BROMIDE 100 MG/10ML IV SOLN
INTRAVENOUS | Status: DC | PRN
  Administered 2012-11-12: 40 mg via INTRAVENOUS

## 2012-11-12 MED ORDER — GLYCOPYRROLATE 0.2 MG/ML IJ SOLN
INTRAMUSCULAR | Status: AC
  Filled 2012-11-12: qty 1

## 2012-11-12 MED ORDER — PROPOFOL INFUSION 10 MG/ML OPTIME
INTRAVENOUS | Status: DC | PRN
  Administered 2012-11-12: 200 mL via INTRAVENOUS

## 2012-11-12 MED ORDER — LIDOCAINE HCL (CARDIAC) 20 MG/ML IV SOLN
INTRAVENOUS | Status: DC | PRN
  Administered 2012-11-12: 60 mg via INTRAVENOUS

## 2012-11-12 MED ORDER — LIDOCAINE HCL 1 % IJ SOLN
INTRAMUSCULAR | Status: DC | PRN
  Administered 2012-11-12: 20 mL

## 2012-11-12 MED ORDER — LACTATED RINGERS IR SOLN
Status: DC | PRN
  Administered 2012-11-12: 3000 mL

## 2012-11-12 MED ORDER — MIDAZOLAM HCL 5 MG/5ML IJ SOLN
INTRAMUSCULAR | Status: DC | PRN
  Administered 2012-11-12: 2 mg via INTRAVENOUS

## 2012-11-12 MED ORDER — NEOSTIGMINE METHYLSULFATE 1 MG/ML IJ SOLN
INTRAMUSCULAR | Status: AC
  Filled 2012-11-12: qty 1

## 2012-11-12 MED ORDER — EPHEDRINE 5 MG/ML INJ
INTRAVENOUS | Status: AC
  Filled 2012-11-12: qty 10

## 2012-11-12 MED ORDER — DEXAMETHASONE SODIUM PHOSPHATE 10 MG/ML IJ SOLN
INTRAMUSCULAR | Status: AC
  Filled 2012-11-12: qty 1

## 2012-11-12 MED ORDER — PHENYLEPHRINE HCL 10 MG/ML IJ SOLN
INTRAMUSCULAR | Status: DC | PRN
  Administered 2012-11-12 (×3): 40 ug via INTRAVENOUS

## 2012-11-12 MED ORDER — MIDAZOLAM HCL 2 MG/2ML IJ SOLN
INTRAMUSCULAR | Status: AC
  Filled 2012-11-12: qty 2

## 2012-11-12 MED ORDER — BUPIVACAINE HCL (PF) 0.25 % IJ SOLN
INTRAMUSCULAR | Status: DC | PRN
  Administered 2012-11-12: 7 mL

## 2012-11-12 MED ORDER — KETOROLAC TROMETHAMINE 30 MG/ML IJ SOLN: 15.0000 mg | Freq: Once | INTRAMUSCULAR | Status: DC | PRN

## 2012-11-12 MED ORDER — ONDANSETRON HCL 4 MG/2ML IJ SOLN
INTRAMUSCULAR | Status: DC | PRN
  Administered 2012-11-12: 4 mg via INTRAVENOUS

## 2012-11-12 MED ORDER — ACETAMINOPHEN 160 MG/5ML PO SOLN
ORAL | Status: AC
  Administered 2012-11-12: 975 mg via ORAL
  Filled 2012-11-12: qty 40.6

## 2012-11-12 MED ORDER — LIDOCAINE HCL (CARDIAC) 20 MG/ML IV SOLN
INTRAVENOUS | Status: AC
  Filled 2012-11-12: qty 5

## 2012-11-12 MED ORDER — FENTANYL CITRATE 0.05 MG/ML IJ SOLN: 25.0000 ug | INTRAMUSCULAR | Status: DC | PRN

## 2012-11-12 MED ORDER — KETOROLAC TROMETHAMINE 30 MG/ML IJ SOLN
INTRAMUSCULAR | Status: DC | PRN
  Administered 2012-11-12: 30 mg via INTRAVENOUS

## 2012-11-12 MED ORDER — METOCLOPRAMIDE HCL 5 MG/ML IJ SOLN: 10.0000 mg | Freq: Once | INTRAMUSCULAR | Status: DC | PRN

## 2012-11-12 MED ORDER — ACETAMINOPHEN 160 MG/5ML PO SOLN: 975.0000 mg | Freq: Once | ORAL | Status: AC

## 2012-11-12 MED ORDER — FENTANYL CITRATE 0.05 MG/ML IJ SOLN
INTRAMUSCULAR | Status: DC | PRN
  Administered 2012-11-12 (×2): 50 ug via INTRAVENOUS
  Administered 2012-11-12: 100 ug via INTRAVENOUS

## 2012-11-12 MED ORDER — GLYCOPYRROLATE 0.2 MG/ML IJ SOLN
INTRAMUSCULAR | Status: DC | PRN
  Administered 2012-11-12: 0.6 mg via INTRAVENOUS
  Administered 2012-11-12 (×2): 0.1 mg via INTRAVENOUS

## 2012-11-12 MED ORDER — LACTATED RINGERS IV SOLN
INTRAVENOUS | Status: DC
  Administered 2012-11-12 (×3): via INTRAVENOUS

## 2012-11-12 MED ORDER — GLYCOPYRROLATE 0.2 MG/ML IJ SOLN
INTRAMUSCULAR | Status: AC
  Filled 2012-11-12: qty 3

## 2012-11-12 MED ORDER — DEXAMETHASONE SODIUM PHOSPHATE 4 MG/ML IJ SOLN
INTRAMUSCULAR | Status: DC | PRN
  Administered 2012-11-12: 8 mg via INTRAVENOUS

## 2012-11-12 MED ORDER — NEOSTIGMINE METHYLSULFATE 1 MG/ML IJ SOLN
INTRAMUSCULAR | Status: DC | PRN
  Administered 2012-11-12: 3 mg via INTRAVENOUS

## 2012-11-12 MED ORDER — FENTANYL CITRATE 0.05 MG/ML IJ SOLN
INTRAMUSCULAR | Status: AC
  Filled 2012-11-12: qty 10

## 2012-11-12 MED ORDER — BUPIVACAINE HCL (PF) 0.25 % IJ SOLN
INTRAMUSCULAR | Status: AC
  Filled 2012-11-12: qty 30

## 2012-11-12 MED ORDER — PHENYLEPHRINE 40 MCG/ML (10ML) SYRINGE FOR IV PUSH (FOR BLOOD PRESSURE SUPPORT)
PREFILLED_SYRINGE | INTRAVENOUS | Status: AC
  Filled 2012-11-12: qty 5

## 2012-11-12 SURGICAL SUPPLY — 38 items
ABLATOR ENDOMETRIAL BIPOLAR (ABLATOR) ×1 IMPLANT
ADH SKN CLS APL DERMABOND .7 (GAUZE/BANDAGES/DRESSINGS) ×3
BAG SPEC RTRVL LRG 6X4 10 (ENDOMECHANICALS)
BARRIER ADHS 3X4 INTERCEED (GAUZE/BANDAGES/DRESSINGS) IMPLANT
BRR ADH 4X3 ABS CNTRL BYND (GAUZE/BANDAGES/DRESSINGS)
CABLE HIGH FREQUENCY MONO STRZ (ELECTRODE) IMPLANT
CATH ROBINSON RED A/P 16FR (CATHETERS) ×1 IMPLANT
CHLORAPREP W/TINT 26ML (MISCELLANEOUS) ×8 IMPLANT
CLOTH BEACON ORANGE TIMEOUT ST (SAFETY) ×4 IMPLANT
CONTAINER PREFILL 10% NBF 60ML (FORM) IMPLANT
DERMABOND ADVANCED (GAUZE/BANDAGES/DRESSINGS) ×1
DERMABOND ADVANCED .7 DNX12 (GAUZE/BANDAGES/DRESSINGS) ×3 IMPLANT
DILATOR CANAL MILEX (MISCELLANEOUS) ×3 IMPLANT
DRESSING TELFA 8X3 (GAUZE/BANDAGES/DRESSINGS) ×4 IMPLANT
GLOVE BIO SURGEON STRL SZ 6.5 (GLOVE) ×4 IMPLANT
GLOVE BIOGEL PI IND STRL 7.0 (GLOVE) ×7 IMPLANT
GLOVE BIOGEL PI INDICATOR 7.0 (GLOVE) ×3
GOWN PREVENTION PLUS LG XLONG (DISPOSABLE) ×8 IMPLANT
GOWN STRL REIN XL XLG (GOWN DISPOSABLE) ×8 IMPLANT
NS IRRIG 1000ML POUR BTL (IV SOLUTION) ×4 IMPLANT
PACK HYSTEROSCOPY LF (CUSTOM PROCEDURE TRAY) ×4 IMPLANT
PACK LAPAROSCOPY BASIN (CUSTOM PROCEDURE TRAY) ×4 IMPLANT
PAD OB MATERNITY 4.3X12.25 (PERSONAL CARE ITEMS) ×4 IMPLANT
POUCH SPECIMEN RETRIEVAL 10MM (ENDOMECHANICALS) IMPLANT
PROTECTOR NERVE ULNAR (MISCELLANEOUS) ×4 IMPLANT
SEALER TISSUE G2 CVD JAW 35 (ENDOMECHANICALS) IMPLANT
SEALER TISSUE G2 CVD JAW 45CM (ENDOMECHANICALS) IMPLANT
SET IRRIG TUBING LAPAROSCOPIC (IRRIGATION / IRRIGATOR) IMPLANT
STRIP CLOSURE SKIN 1/2X4 (GAUZE/BANDAGES/DRESSINGS) IMPLANT
SUT VIC AB 3-0 PS2 18 (SUTURE) ×4
SUT VIC AB 3-0 PS2 18XBRD (SUTURE) ×3 IMPLANT
SUT VICRYL 0 UR6 27IN ABS (SUTURE) ×4 IMPLANT
TOWEL OR 17X24 6PK STRL BLUE (TOWEL DISPOSABLE) ×8 IMPLANT
TROCAR OPTI TIP 5M 100M (ENDOMECHANICALS) ×4 IMPLANT
TROCAR XCEL NON-BLD 11X100MML (ENDOMECHANICALS) ×4 IMPLANT
TROCAR XCEL OPT SLVE 5M 100M (ENDOMECHANICALS) IMPLANT
WARMER LAPAROSCOPE (MISCELLANEOUS) ×4 IMPLANT
WATER STERILE IRR 1000ML POUR (IV SOLUTION) ×4 IMPLANT

## 2012-11-12 NOTE — Anesthesia Preprocedure Evaluation (Signed)
Anesthesia Evaluation  Patient identified by MRN, date of birth, ID band Patient awake    Reviewed: Allergy & Precautions, H&P , NPO status , Patient's Chart, lab work & pertinent test results, reviewed documented beta blocker date and time   History of Anesthesia Complications Negative for: history of anesthetic complications  Airway Mallampati: II TM Distance: >3 FB Neck ROM: full    Dental  (+) Teeth Intact   Pulmonary neg pulmonary ROS,  breath sounds clear to auscultation  Pulmonary exam normal       Cardiovascular Exercise Tolerance: Good hypertension, On Medications Rhythm:regular Rate:Normal     Neuro/Psych PSYCHIATRIC DISORDERS (depression) negative neurological ROS     GI/Hepatic Neg liver ROS, GERD-  Medicated,  Endo/Other  Morbid obesity  Renal/GU negative Renal ROS  Female GU complaint     Musculoskeletal   Abdominal   Peds  Hematology  (+) REFUSES BLOOD PRODUCTS,   Anesthesia Other Findings "allergy" to mobic is heart racing - takes ibuprofen daily without problems - OK for toradol  Reproductive/Obstetrics negative OB ROS                           Anesthesia Physical Anesthesia Plan  ASA: III  Anesthesia Plan: General ETT   Post-op Pain Management:    Induction:   Airway Management Planned:   Additional Equipment:   Intra-op Plan:   Post-operative Plan:   Informed Consent: I have reviewed the patients History and Physical, chart, labs and discussed the procedure including the risks, benefits and alternatives for the proposed anesthesia with the patient or authorized representative who has indicated his/her understanding and acceptance.   Dental Advisory Given  Plan Discussed with: CRNA and Surgeon  Anesthesia Plan Comments:         Anesthesia Quick Evaluation

## 2012-11-12 NOTE — Progress Notes (Signed)
D/C instructions completed with patient at 1030, not 1000

## 2012-11-12 NOTE — Anesthesia Postprocedure Evaluation (Signed)
Anesthesia Post Note  Patient: Ariana Juarez  Procedure(s) Performed: Procedure(s) (LRB): LAPAROSCOPY DIAGNOSTIC (N/A)  Anesthesia type: General  Patient location: PACU  Post pain: Pain level controlled  Post assessment: Post-op Vital signs reviewed  Last Vitals:  Filed Vitals:   11/12/12 0930  BP:   Pulse: 86  Temp:   Resp: 15    Post vital signs: Reviewed  Level of consciousness: sedated  Complications: No apparent anesthesia complications

## 2012-11-12 NOTE — Transfer of Care (Signed)
Immediate Anesthesia Transfer of Care Note  Patient: Ariana Juarez  Procedure(s) Performed: Procedure(s): LAPAROSCOPY DIAGNOSTIC (N/A)  Patient Location: PACU  Anesthesia Type:General  Level of Consciousness: awake, alert , oriented and patient cooperative  Airway & Oxygen Therapy: Patient Spontanous Breathing and Patient connected to nasal cannula oxygen  Post-op Assessment: Report given to PACU RN and Post -op Vital signs reviewed and stable  Post vital signs: stable  Complications: No apparent anesthesia complications

## 2012-11-13 NOTE — Op Note (Signed)
NAMEPHOEBIE, Ariana Juarez                 ACCOUNT NO.:  [id number]  MEDICAL RECORD NO.:  [id number]  LOCATION:  [facility]                          FACILITY:  [facility]  PHYSICIAN:  Zelphia Cairo, MD    DATE OF BIRTH:  28-Jul-1965  DATE OF PROCEDURE:  11/12/2012 DATE OF DISCHARGE:  11/12/2012                              OPERATIVE REPORT   PREOPERATIVE DIAGNOSES: 1. Left lower quadrant pain. 2. Hydrosalpinx.  POSTOPERATIVE DIAGNOSES: 1. Left lower quadrant pain. 2. Hydrosalpinx. 3. Endometriosis. 4. Pelvic adhesions.  SURGERY:  Diagnostic laparoscopy.  SURGEON:  Zelphia Cairo, MD  ANESTHESIA:  General.  SPECIMENS:  None.  CONDITION:  Stable to recovery room.  PROCEDURE:  The patient was taken to the operating room.  After informed consent was obtained, she was given general anesthesia.  She was prepped and draped in sterile fashion.  A Foley catheter was inserted sterilely. Bivalve speculum was placed in the vagina and a single-tooth tenaculum attached to the anterior lip of the cervix.  I attempted to put a Hulka clamp on the cervix, however, the internal cervical os was stenotic.  Os binder was used to help dilate, however, could not adequately put the Hulka clamp on the cervix.  Single-tooth tenaculum was left on the cervix and our attention was turned to the abdomen.  Infraumbilical skin incision was made with a scalpel after 0.25% Marcaine was injected for local anesthesia.  The incision was extended bluntly to the level of the fascia using a Kelly clamp.  Optical trocar was inserted under direct visualization.  Once intraperitoneal placement was confirmed, CO2 was turned on and a survey of the abdomen and pelvis was performed.  The patient was placed in Trendelenburg position, 0.25% Marcaine was injected at the suprapubic incision.  5mm trocar was partially inserted, however, there was too much redundant anterior peritoneum and so another incision was made 1 cm above and 5mm  trocar was inserted under direct visualization.  Once intraperitoneal placement was confirmed, the blunt probe was used to retract the bowel away from the pelvis.  Dense adhesions were noted.  The pelvis was essentially frozen. The bladder was scarred completely over the fundus of the uterus.  I could not identify either fallopian tube or ovary because of extensive scarring.  Endometriosis was noted on the left pelvic sidewall.  At this point, decision was made to conclude the surgery.  All instruments and trocars were removed from the abdomen.  CO2 was turned off and released from the abdomen.  Through the trocar, a deep stitch was placed in the infraumbilical skin incision.  The skin was closed with Vicryl.  Tenaculum was removed from the cervix.  Foley catheter was removed and the patient was taken to the recovery room in stable condition.     Zelphia Cairo, MD     GA/MEDQ  D:  11/12/2012  T:  11/13/2012  Job:  [id number]

## 2012-11-14 ENCOUNTER — Encounter: Payer: Self-pay | Admitting: Physician Assistant

## 2013-02-25 ENCOUNTER — Other Ambulatory Visit: Payer: Self-pay | Admitting: Physician Assistant

## 2013-02-26 ENCOUNTER — Other Ambulatory Visit: Payer: Self-pay | Admitting: Physician Assistant

## 2013-03-01 ENCOUNTER — Other Ambulatory Visit: Payer: Self-pay

## 2013-03-01 MED ORDER — LISINOPRIL-HYDROCHLOROTHIAZIDE 20-25 MG PO TABS: 1.0000 | ORAL_TABLET | Freq: Every day | ORAL | Status: DC

## 2013-03-02 ENCOUNTER — Other Ambulatory Visit: Payer: Self-pay | Admitting: Physician Assistant

## 2013-04-07 ENCOUNTER — Ambulatory Visit (INDEPENDENT_AMBULATORY_CARE_PROVIDER_SITE_OTHER): Payer: Managed Care, Other (non HMO) | Admitting: Family Medicine

## 2013-04-07 VITALS — BP 130/88 | HR 87 | Temp 98.5°F | Resp 16 | Ht 64.0 in | Wt 204.0 lb

## 2013-04-07 DIAGNOSIS — F329 Major depressive disorder, single episode, unspecified: Secondary | ICD-10-CM

## 2013-04-07 DIAGNOSIS — I10 Essential (primary) hypertension: Secondary | ICD-10-CM

## 2013-04-07 DIAGNOSIS — F32A Depression, unspecified: Secondary | ICD-10-CM

## 2013-04-07 MED ORDER — LISINOPRIL-HYDROCHLOROTHIAZIDE 20-25 MG PO TABS: 1.0000 | ORAL_TABLET | Freq: Every day | ORAL | Status: DC

## 2013-04-07 MED ORDER — SERTRALINE HCL 50 MG PO TABS: 50.0000 mg | ORAL_TABLET | Freq: Every day | ORAL | Status: DC

## 2013-04-07 NOTE — Progress Notes (Signed)
This chart was scribed for Dr. Milus Glazier, MD by Ardelia Mems, Scribe. The patient was seen in Room 5 and the patient's care was started at 7:06 PM.  Subjective:    Patient ID: Ariana Juarez, female    DOB: [birth year], 47 y.o.   MRN: [id number]  Hypertension This is a chronic problem. Condition status: Here today for a prescription refill. Associated symptoms include anxiety.   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 She has a history of HTN, hyperlipidemia. She states that she is feeling well and she is not complaining of any illnesses or injuries. She states that she has been taking Sertraline, Lisinopril and Pravastatin, although, she states that she has not been fully compliant with her prescribed Pravastatin. She states that she has been taking Sertraline more so for depression than anxiety. She states that she normally takes this medication once daily. She expresses concern that she may be at risk for diabetes, and expresses that her work will test her for this and other conditions, and that she would like a note written to her work to perform tests.  She also states that she has endometriosis and may need a hysterectomy. She states that she is being followed by Dr. Zelphia Cairo, OB-GYN, for this.  Past Medical History  Diagnosis Date  . Hypertension   . Hyperlipidemia   . Depression   . Dysmenorrhea   . Anemia   . Glaucoma   . Shortness of breath     on exertion, ie climbing stairs  . GERD (gastroesophageal reflux disease)    Past Surgical History  Procedure Laterality Date  . Dilation and curettage of uterus    . Dental surgery      bone grafting  . Laparoscopy N/A 11/12/2012    Procedure: LAPAROSCOPY DIAGNOSTIC;  Surgeon: Zelphia Cairo, MD;  Location: [facility];  Service: Gynecology;  Laterality: N/A;   Review of Systems  Psychiatric/Behavioral:       Depression      Objective:   Physical Exam  Nursing note and vitals  reviewed. Constitutional: She is oriented to person, place, and time. She appears well-developed and well-nourished.  HENT:  Head: Normocephalic and atraumatic.  Eyes: EOM are normal. Pupils are equal, round, and reactive to light.  Neck: Normal range of motion. Neck supple.  Cardiovascular: Normal rate and regular rhythm.   Pulmonary/Chest: Effort normal. No respiratory distress.  Abdominal: Soft. There is no tenderness.  Musculoskeletal: Normal range of motion. She exhibits no tenderness.  Neurological: She is alert and oriented to person, place, and time.  Skin: Skin is warm. No rash noted.  Psychiatric: She has a normal mood and affect. Her behavior is normal.      Assessment & Plan:  Hypertension - Plan: lisinopril-hydrochlorothiazide (PRINZIDE,ZESTORETIC) 20-25 MG per tablet  Depression - Plan: sertraline (ZOLOFT) 50 MG tablet  Signed, Elvina Sidle, MD   This chart was scribed in my presence and reviewed by me personally.

## 2013-04-30 ENCOUNTER — Telehealth: Payer: Self-pay

## 2013-04-30 DIAGNOSIS — E7800 Pure hypercholesterolemia, unspecified: Secondary | ICD-10-CM

## 2013-04-30 MED ORDER — PRAVASTATIN SODIUM 40 MG PO TABS: 40.0000 mg | ORAL_TABLET | Freq: Every day | ORAL | Status: DC

## 2013-04-30 NOTE — Telephone Encounter (Signed)
Pt would like to have Lipitor or pravastatin called into the pharmacy. [facility]# [phone]  Pharmacy: [facility]

## 2013-04-30 NOTE — Telephone Encounter (Signed)
rx sent to pharmacy

## 2013-05-01 NOTE — Telephone Encounter (Signed)
lmom that rx was sent to pharmacy 

## 2013-05-21 ENCOUNTER — Telehealth: Payer: Self-pay | Admitting: Radiology

## 2013-05-21 ENCOUNTER — Ambulatory Visit: Payer: Managed Care, Other (non HMO)

## 2013-05-21 ENCOUNTER — Ambulatory Visit (HOSPITAL_COMMUNITY)
Admission: RE | Admit: 2013-05-21 | Discharge: 2013-05-21 | Disposition: A | Discharge status: Home / Self Care | Payer: Managed Care, Other (non HMO) | Source: Ambulatory Visit | Attending: Emergency Medicine | Admitting: Emergency Medicine

## 2013-05-21 ENCOUNTER — Ambulatory Visit (INDEPENDENT_AMBULATORY_CARE_PROVIDER_SITE_OTHER): Payer: Managed Care, Other (non HMO) | Admitting: Emergency Medicine

## 2013-05-21 VITALS — BP 142/84 | HR 100 | Temp 99.7°F | Resp 20 | Ht 64.0 in | Wt 199.8 lb

## 2013-05-21 DIAGNOSIS — D72829 Elevated white blood cell count, unspecified: Secondary | ICD-10-CM

## 2013-05-21 DIAGNOSIS — R1012 Left upper quadrant pain: Secondary | ICD-10-CM

## 2013-05-21 DIAGNOSIS — N9489 Other specified conditions associated with female genital organs and menstrual cycle: Secondary | ICD-10-CM | POA: Insufficient documentation

## 2013-05-21 DIAGNOSIS — K8020 Calculus of gallbladder without cholecystitis without obstruction: Secondary | ICD-10-CM | POA: Insufficient documentation

## 2013-05-21 DIAGNOSIS — D739 Disease of spleen, unspecified: Secondary | ICD-10-CM | POA: Insufficient documentation

## 2013-05-21 DIAGNOSIS — K7689 Other specified diseases of liver: Secondary | ICD-10-CM | POA: Insufficient documentation

## 2013-05-21 LAB — POCT UA - MICROSCOPIC ONLY
Crystals, Ur, HPF, POC: NEGATIVE
Mucus, UA: NEGATIVE
Yeast, UA: NEGATIVE

## 2013-05-21 LAB — POCT CBC
HCT, POC: 45.1 % (ref 37.7–47.9)
Hemoglobin: 14.3 g/dL (ref 12.2–16.2)
Lymph, poc: 2.9 (ref 0.6–3.4)
MCHC: 31.7 g/dL — AB (ref 31.8–35.4)
MCV: 87.3 fL (ref 80–97)
POC Granulocyte: 11.8 — AB (ref 2–6.9)
POC LYMPH PERCENT: 18.6 %L (ref 10–50)
WBC: 15.4 10*3/uL — AB (ref 4.6–10.2)

## 2013-05-21 LAB — POCT URINALYSIS DIPSTICK
Bilirubin, UA: NEGATIVE
Ketones, UA: NEGATIVE
Protein, UA: NEGATIVE
pH, UA: 6

## 2013-05-21 MED ORDER — IOHEXOL 300 MG/ML  SOLN
100.0000 mL | Freq: Once | INTRAMUSCULAR | Status: AC | PRN
  Administered 2013-05-21: 100 mL via INTRAVENOUS

## 2013-05-21 MED ORDER — IOHEXOL 300 MG/ML  SOLN
50.0000 mL | Freq: Once | INTRAMUSCULAR | Status: AC | PRN
  Administered 2013-05-21: 50 mL via ORAL

## 2013-05-21 NOTE — Progress Notes (Signed)
[facility] [address] [place] [address] [phone]  Date:  05/21/2013   Name:  Ariana Juarez   DOB:  [birth year]   MRN:  [id number]  PCP:  Stevie Ertle,CHELLE, PA-C    Chief Complaint: Abdominal Pain   History of Present Illness:  Ariana Juarez is a 47 y.o. very pleasant female patient who presents with the following:  Having pain in left upper quadrant since yesterday before lunch and has been constant.  If still the pain diminishes and increases with deep breathing or vigorous movement.  Pain present with walking when her left foot hit the ground.  No history of cough or coryza.  No nausea or vomiting.  No bleeding or bruising.  Normal appetite.  No wheezing or shortness of breath.  No rash.  No history of injury or overuse.  Pain has been present previously but never this bad.  No stool change, blood in stools or black stools.  No GU or GYN symptoms or back pain.  No improvement with over the counter medications or other home remedies. Denies other complaint or health concern today.   Patient Active Problem List   Diagnosis Date Noted  . Depression 08/27/2012  . Dysmenorrhea   . HTN (hypertension) 12/19/2011  . Hypercholesterolemia 12/19/2011    Past Medical History  Diagnosis Date  . Hypertension   . Hyperlipidemia   . Depression   . Dysmenorrhea   . Anemia   . Glaucoma   . Shortness of breath     on exertion, ie climbing stairs  . GERD (gastroesophageal reflux disease)     Past Surgical History  Procedure Laterality Date  . Dilation and curettage of uterus    . Dental surgery      bone grafting  . Laparoscopy N/A 11/12/2012    Procedure: LAPAROSCOPY DIAGNOSTIC;  Surgeon: Zelphia Cairo, MD;  Location: [facility];  Service: Gynecology;  Laterality: N/A;    History  Substance Use Topics  . Smoking status: Never Smoker   . Smokeless tobacco: Never Used  . Alcohol Use: Yes     Comment: one drink every 2 years    Family History  Problem  Relation Age of Onset  . Cancer Mother 2    uterine  . Cancer Father     lung/liver  . Spina bifida Brother     mild  . Crohn's disease Brother 52  . Mental retardation Brother     bipolar disorder    Allergies  Allergen Reactions  . Mobic [Meloxicam]     Patient states she "felt her heart beat" while on med    Medication list has been reviewed and updated.  Current Outpatient Prescriptions on File Prior to Visit  Medication Sig Dispense Refill  . Ascorbic Acid (VITAMIN C PO) Take by mouth daily as needed.       . ferrous sulfate 325 (65 FE) MG tablet Take 325 mg by mouth daily as needed (for periods.).      Marland Kitchen Flaxseed, Linseed, (BL FLAX SEED OIL PO) Take by mouth as needed.       Marland Kitchen HYDROcodone-acetaminophen (NORCO/VICODIN) 5-325 MG per tablet Take 1.5 tablets by mouth every 6 (six) hours as needed for pain.      Marland Kitchen ibuprofen (ADVIL,MOTRIN) 200 MG tablet Take 600 mg by mouth every 6 (six) hours as needed for pain.      Marland Kitchen lisinopril-hydrochlorothiazide (PRINZIDE,ZESTORETIC) 20-25 MG per tablet Take 1 tablet by mouth daily. PATIENT NEEDS OFFICE  VISIT FOR ADDITIONAL REFILLS  90 tablet  3  . LUTEIN PO Take by mouth daily as needed (for eye health.).       Marland Kitchen Nutritional Supplements (NUTRITIONAL SUPPLEMENT PO) Take 2 oz by mouth daily. VEMMA      . pravastatin (PRAVACHOL) 40 MG tablet Take 1 tablet (40 mg total) by mouth daily.  90 tablet  1  . sertraline (ZOLOFT) 50 MG tablet Take 1 tablet (50 mg total) by mouth daily.  90 tablet  3  . Travoprost, BAK Free, (TRAVATAN) 0.004 % SOLN ophthalmic solution Place 1 drop into the left eye at bedtime.      . cyclobenzaprine (FLEXERIL) 10 MG tablet Take 10 mg by mouth 3 (three) times daily as needed for muscle spasms.      Marland Kitchen leuprolide (LUPRON) 3.75 MG injection Inject 3.75 mg into the muscle every 28 (twenty-eight) days.      . naproxen sodium (ANAPROX) 550 MG tablet Take 550 mg by mouth 2 (two) times daily with a meal.      . norethindrone  (AYGESTIN) 5 MG tablet Take 5 mg by mouth daily.       No current facility-administered medications on file prior to visit.    Review of Systems:  As per HPI, otherwise negative.    Physical Examination: Filed Vitals:   05/21/13 1148  BP: 142/84  Pulse: 100  Temp: 99.7 F (37.6 C)  Resp: 20   Filed Vitals:   05/21/13 1148  Height: 5\' 4"  (1.626 m)  Weight: 199 lb 12.8 oz (90.629 kg)   Body mass index is 34.28 kg/(m^2). Ideal Body Weight: Weight in (lb) to have BMI = 25: 145.3  GEN: WDWN, NAD, Non-toxic, A & O x 3 HEENT: Atraumatic, Normocephalic. Neck supple. No masses, No LAD. Ears and Nose: No external deformity. CV: RRR, No M/G/R. No JVD. No thrill. No extra heart sounds. PULM: CTA B, no wheezes, crackles, rhonchi. No retractions. No resp. distress. No accessory muscle use. ABD: S, NT, ND, +BS. No rebound. No hepatomegaly.  Spleen suspiciously large. EXTR: No c/c/e NEURO Normal gait.  PSYCH: Normally interactive. Conversant. Not depressed or anxious appearing.  Calm demeanor.    Assessment and Plan: Abdominal pain at this point undiagnosed.  She left the office without notifying the staff Needs a CT   Signed,  Phillips Odor, MD   [facility] reading (PRIMARY) by  Dr. Dareen Piano.  Negative .  Results for orders placed in visit on 05/21/13  POCT CBC      Result Value Range   WBC 15.4 (*) 4.6 - 10.2 K/uL   Lymph, poc 2.9  0.6 - 3.4   POC LYMPH PERCENT 18.6  10 - 50 %L   MID (cbc) 0.8  0 - 0.9   POC MID % 5.0  0 - 12 %M   POC Granulocyte 11.8 (*) 2 - 6.9   Granulocyte percent 76.4  37 - 80 %G   RBC 5.17  4.04 - 5.48 M/uL   Hemoglobin 14.3  12.2 - 16.2 g/dL   HCT, POC 16.1  09.6 - 47.9 %   MCV 87.3  80 - 97 fL   MCH, POC 27.7  27 - 31.2 pg   MCHC 31.7 (*) 31.8 - 35.4 g/dL   RDW, POC 04.5     Platelet Count, POC 318  142 - 424 K/uL   MPV 10.3  0 - 99.8 fL  POCT UA - MICROSCOPIC ONLY  Result Value Range   WBC, Ur, HPF, POC 0-1     RBC, urine,  microscopic 0-3     Bacteria, U Microscopic negative     Mucus, UA negative     Epithelial cells, urine per micros 1-3     Crystals, Ur, HPF, POC negative     Casts, Ur, LPF, POC negative     Yeast, UA negative    POCT URINALYSIS DIPSTICK      Result Value Range   Color, UA yellow     Clarity, UA clear     Glucose, UA negative     Bilirubin, UA negative     Ketones, UA negative     Spec Grav, UA 1.015     Blood, UA trace-intact     pH, UA 6.0     Protein, UA negative     Urobilinogen, UA 0.2     Nitrite, UA negative     Leukocytes, UA Negative

## 2013-05-21 NOTE — Telephone Encounter (Signed)
Patient needs CT Scan [facility] can do this today. Revonda Standard and Dr Dareen Piano have finally gotten the authorization from the insurance company. They are faxing to [place] with the authorization number. Explained to patient this has to be done today because of her elevated white count. Patient is going to go to [facility] radiology department now. She is advised she will need to drink contrast for 2 hours prior to the scan.

## 2013-05-24 ENCOUNTER — Telehealth: Payer: Self-pay

## 2013-05-24 DIAGNOSIS — K8020 Calculus of gallbladder without cholecystitis without obstruction: Secondary | ICD-10-CM

## 2013-05-24 NOTE — Telephone Encounter (Signed)
Sure make the referral.  Thanks

## 2013-05-24 NOTE — Telephone Encounter (Signed)
Notes Recorded by Phillips Odor, MD on 05/22/2013 at 8:06 AM Has a small uterine mass that should be further evaluated by her gyn. Not causing her pain.  She has multiple gall stones that should NOT be causing her Left sided pain. If the pain persists, we should see her back      Called her, to advise. She wants [facility] to [facility]; this is faxed for her. She wants to know if she can be referred to Dr Derrell Lolling, to consider removal of her gallbladder, I have indicated to her since her gallbladder is not symptomatic for her she should not need any surgery. She indicates she has met her deductible this year and wants to go ahead with the referral, pended please advise.

## 2013-05-24 NOTE — Telephone Encounter (Signed)
Patient calling about her ct scan she had on Friday please call [phone]

## 2013-06-02 ENCOUNTER — Encounter (INDEPENDENT_AMBULATORY_CARE_PROVIDER_SITE_OTHER): Payer: Self-pay | Admitting: General Surgery

## 2013-06-02 ENCOUNTER — Ambulatory Visit (INDEPENDENT_AMBULATORY_CARE_PROVIDER_SITE_OTHER): Payer: Managed Care, Other (non HMO) | Admitting: General Surgery

## 2013-06-02 VITALS — BP 168/84 | HR 108 | Resp 20 | Ht 64.0 in | Wt 201.0 lb

## 2013-06-02 DIAGNOSIS — R109 Unspecified abdominal pain: Secondary | ICD-10-CM

## 2013-06-02 DIAGNOSIS — K8020 Calculus of gallbladder without cholecystitis without obstruction: Secondary | ICD-10-CM | POA: Insufficient documentation

## 2013-06-02 NOTE — Progress Notes (Signed)
Patient ID: Ariana Juarez, female   DOB: 31-Oct-1965, 47 y.o.   MRN: [id number]  Chief Complaint  Patient presents with  . Cholelithiasis    HPI Ariana Juarez is a 47 y.o. female.  She is referred by Dr. Thornton Papas for left upper quadrant pain and gallstones.  The patient is here with her mother. She is in no distress today. She says that one year ago she had 2 brief episodes of right upper quadrant pain but no nausea or vomiting. This has not recurred. Approximately 12 days ago she had the gradual onset of left upper quadrant pain. This gradually got worse. Labs lasted for 4 days and then resolved. She said that it hurt when she took a deep breath. She denied cough, fever, or sputum production. There is no nausea or vomiting. Normal bowel movements. She denies leg swelling or leg pain. She saw her physician the day after the pain started. At that time WBC was 15,400, hemoglobin 14.4, urinalysis clear, negative bilirubin, negative blood. CT scan showed multiple gallstones but no signs of inflammatory change. There is a 5.1 cm mass in her uterus thought to be a  fibroid. Otherwise the CT scan was negative.  Comorbidities include endometriosis, she is on a deep hormone injection. Glaucoma. GERD. Depression, on Zoloft. Hyperlipidemia. Hypertension. She denies cardiac disease, pulmonary disease, or history of deep venous thrombosis.  HPI  Past Medical History  Diagnosis Date  . Hypertension   . Hyperlipidemia   . Depression   . Dysmenorrhea   . Anemia   . Glaucoma   . Shortness of breath     on exertion, ie climbing stairs  . GERD (gastroesophageal reflux disease)     Past Surgical History  Procedure Laterality Date  . Dilation and curettage of uterus    . Dental surgery      bone grafting  . Laparoscopy N/A 11/12/2012    Procedure: LAPAROSCOPY DIAGNOSTIC;  Surgeon: Zelphia Cairo, MD;  Location: [facility];  Service: Gynecology;  Laterality: N/A;    Family History  Problem Relation Age  of Onset  . Cancer Mother 60    uterine  . Cancer Father     lung/liver  . Spina bifida Brother     mild  . Crohn's disease Brother 51  . Mental retardation Brother     bipolar disorder    Social History History  Substance Use Topics  . Smoking status: Never Smoker   . Smokeless tobacco: Never Used  . Alcohol Use: Yes     Comment: one drink every 2 years    Allergies  Allergen Reactions  . Mobic [Meloxicam]     Patient states she "felt her heart beat" while on med    Current Outpatient Prescriptions  Medication Sig Dispense Refill  . Ascorbic Acid (VITAMIN C PO) Take by mouth daily as needed.       Marland Kitchen HYDROcodone-acetaminophen (NORCO/VICODIN) 5-325 MG per tablet Take 1.5 tablets by mouth every 6 (six) hours as needed for pain.      Marland Kitchen ibuprofen (ADVIL,MOTRIN) 200 MG tablet Take 600 mg by mouth every 6 (six) hours as needed for pain.      Marland Kitchen lisinopril-hydrochlorothiazide (PRINZIDE,ZESTORETIC) 20-25 MG per tablet Take 1 tablet by mouth daily. PATIENT NEEDS OFFICE VISIT FOR ADDITIONAL REFILLS  90 tablet  3  . medroxyPROGESTERone (DEPO-PROVERA) 150 MG/ML injection       . pravastatin (PRAVACHOL) 40 MG tablet Take 1 tablet (40 mg total) by mouth daily.  90  tablet  1  . sertraline (ZOLOFT) 50 MG tablet Take 1 tablet (50 mg total) by mouth daily.  90 tablet  3  . Travoprost, BAK Free, (TRAVATAN) 0.004 % SOLN ophthalmic solution Place 1 drop into the left eye at bedtime.      . Nutritional Supplements (NUTRITIONAL SUPPLEMENT PO) Take 2 oz by mouth daily. VEMMA       No current facility-administered medications for this visit.    Review of Systems Review of Systems  Constitutional: Negative for fever, chills and unexpected weight change.  HENT: Negative for congestion, hearing loss, sore throat, trouble swallowing and voice change.   Eyes: Negative for visual disturbance.  Respiratory: Negative for cough and wheezing.   Cardiovascular: Negative for chest pain, palpitations and  leg swelling.  Gastrointestinal: Positive for abdominal pain. Negative for nausea, vomiting, diarrhea, constipation, blood in stool, abdominal distention and anal bleeding.  Genitourinary: Negative for hematuria, vaginal bleeding and difficulty urinating.  Musculoskeletal: Negative for arthralgias.  Skin: Negative for rash and wound.  Neurological: Negative for seizures, syncope and headaches.  Hematological: Negative for adenopathy. Does not bruise/bleed easily.  Psychiatric/Behavioral: Negative for confusion. The patient is nervous/anxious.        See says she is obsessive-compulsive. Her mother agrees.    Blood pressure 168/84, pulse 108, resp. rate 20, height 5\' 4"  (1.626 m), weight 201 lb (91.173 kg), last menstrual period 01/18/2013.  Physical Exam Physical Exam  Constitutional: She is oriented to person, place, and time. She appears well-developed and well-nourished. No distress.  BMI 34.5  HENT:  Head: Normocephalic and atraumatic.  Nose: Nose normal.  Mouth/Throat: No oropharyngeal exudate.  Eyes: Conjunctivae and EOM are normal. Pupils are equal, round, and reactive to light. Left eye exhibits no discharge. No scleral icterus.  Neck: Neck supple. No JVD present. No tracheal deviation present. No thyromegaly present.  Cardiovascular: Normal rate, regular rhythm, normal heart sounds and intact distal pulses.   No murmur heard. Pulmonary/Chest: Effort normal and breath sounds normal. No respiratory distress. She has no wheezes. She has no rales. She exhibits no tenderness.  Abdominal: Soft. Bowel sounds are normal. She exhibits no distension and no mass. There is no tenderness. There is no rebound and no guarding.  Musculoskeletal: She exhibits no edema and no tenderness.  Lymphadenopathy:    She has no cervical adenopathy.  Neurological: She is alert and oriented to person, place, and time. She exhibits normal muscle tone. Coordination normal.  Skin: Skin is warm. No rash  noted. She is not diaphoretic. No erythema. No pallor.  Psychiatric: She has a normal mood and affect. Her behavior is normal. Judgment and thought content normal.    Data Reviewed CT scan. Lab work. Dr. Ewell Poe office note.  Assessment    Left upper quadrant pain of uncertain etiology. Doubt splenic infarct. Doubt pulmonary embolism. Very unlikely that gallbladder attack were present his left upper quadrant pain. No findings of CT scan to explain this  Gallstones. It is possible they are asymptomatic. There is no immediate indication to proceed with cholecystectomy  Obesity  [employer], declines blood products  Hypertension  Depression, on Zoloft  Endometriosis, on Depo hormone shots     Plan    I explained  the uncertainty in the causation of her pain. I told her that her gallstones might be asymptomatic but that they might become symptomatic later  Lymphadenitis recommended  We'll schedule for complete metabolic panel and liver profile and we'll schedule her for a  CCK stimulated hepatobiliary scan  Current see me in one month.        Angelia Mould. Derrell Lolling, M.D., [facility] General and Minimally invasive Surgery Breast and Colorectal Surgery Office:   [phone] Pager:   [phone]  06/02/2013, 4:28 PM

## 2013-06-02 NOTE — Patient Instructions (Signed)
Your episodes of left upper quadrant abdominal pain have resolved. I am unsure as to the cause of this.  You CT scan scan shows gallstones, , But it is not very likely that gallbladder attacks would cause left upper quadrant pain. Her symptoms are not typical for gallbladder attacks.  I advise a low fat diet  You will be scheduled for blood work to check your liver test and you will be scheduled for a biliary scan.  Return to see Dr. Derrell Lolling in one month

## 2013-06-03 ENCOUNTER — Telehealth (INDEPENDENT_AMBULATORY_CARE_PROVIDER_SITE_OTHER): Payer: Self-pay | Admitting: *Deleted

## 2013-06-03 NOTE — Telephone Encounter (Signed)
I spoke with pt and informed her of the appt for her HIDA scan at [facility] on 12/18 with an arrival time of 6:45am.  I instructed pt to be NPO after midnight and to have no pain medications after midnight as well.  Pt is agreeable with this appt.

## 2013-06-17 ENCOUNTER — Encounter (HOSPITAL_COMMUNITY): Payer: Managed Care, Other (non HMO)

## 2013-09-16 ENCOUNTER — Telehealth: Payer: Self-pay

## 2013-09-16 NOTE — Telephone Encounter (Signed)
Needs proof CPE from last February   Will fill out medical release when she comes in.  [phone]

## 2013-09-22 NOTE — Telephone Encounter (Signed)
The patient requests correction of her medical record.    In the family history, the record indicates that her brother has mental retardation and is bipolar.  She requests that this be removed.    Please clarify: Does her brother have bipolar disorder (but not mental retardation, and this should be categorized under mental illness instead), or does he have NEITHER mental illness NOR mental retardation?  I will correct the record and addend the visit so she can have a revised/corrected copy.

## 2013-09-28 NOTE — Telephone Encounter (Signed)
Spoke to pt, i have remove both problems as her brother has never been diagnosed with either of these conditions, this is only the pts observance.

## 2013-09-28 NOTE — Telephone Encounter (Signed)
Alisha,   How do we correct this? From what I was told before medical records can not do this. We have no clinical access to change actual records? Unless that's changed recently?

## 2013-09-28 NOTE — Telephone Encounter (Signed)
This is something clinical needs to address. Medical records may not have answers if patient has additional questions.

## 2013-12-18 ENCOUNTER — Other Ambulatory Visit: Payer: Self-pay | Admitting: Physician Assistant

## 2014-03-17 ENCOUNTER — Ambulatory Visit (INDEPENDENT_AMBULATORY_CARE_PROVIDER_SITE_OTHER): Payer: BC Managed Care – PPO | Admitting: Family Medicine

## 2014-03-17 VITALS — BP 120/80 | HR 86 | Temp 98.5°F | Resp 16 | Ht 64.0 in | Wt 199.0 lb

## 2014-03-17 DIAGNOSIS — E785 Hyperlipidemia, unspecified: Secondary | ICD-10-CM

## 2014-03-17 DIAGNOSIS — I10 Essential (primary) hypertension: Secondary | ICD-10-CM

## 2014-03-17 DIAGNOSIS — E669 Obesity, unspecified: Secondary | ICD-10-CM

## 2014-03-17 LAB — CBC
HCT: 43.5 % (ref 36.0–46.0)
Hemoglobin: 15 g/dL (ref 12.0–15.0)
MCH: 28.5 pg (ref 26.0–34.0)
MCHC: 34.5 g/dL (ref 30.0–36.0)
MCV: 82.5 fL (ref 78.0–100.0)
PLATELETS: 356 10*3/uL (ref 150–400)
RBC: 5.27 MIL/uL — AB (ref 3.87–5.11)
RDW: 14.6 % (ref 11.5–15.5)
WBC: 14.3 10*3/uL — AB (ref 4.0–10.5)

## 2014-03-17 LAB — COMPREHENSIVE METABOLIC PANEL
ALBUMIN: 4.3 g/dL (ref 3.5–5.2)
ALT: 24 U/L (ref 0–35)
AST: 17 U/L (ref 0–37)
Alkaline Phosphatase: 59 U/L (ref 39–117)
BUN: 12 mg/dL (ref 6–23)
CALCIUM: 9.3 mg/dL (ref 8.4–10.5)
CHLORIDE: 104 meq/L (ref 96–112)
CO2: 24 meq/L (ref 19–32)
Creat: 0.73 mg/dL (ref 0.50–1.10)
Glucose, Bld: 93 mg/dL (ref 70–99)
POTASSIUM: 3.6 meq/L (ref 3.5–5.3)
SODIUM: 138 meq/L (ref 135–145)
TOTAL PROTEIN: 6.6 g/dL (ref 6.0–8.3)
Total Bilirubin: 0.4 mg/dL (ref 0.2–1.2)

## 2014-03-17 LAB — LIPID PANEL
Cholesterol: 224 mg/dL — ABNORMAL HIGH (ref 0–200)
HDL: 38 mg/dL — ABNORMAL LOW (ref >39)
LDL CALC: 133 mg/dL — AB (ref 0–99)
Total CHOL/HDL Ratio: 5.9 Ratio
Triglycerides: 264 mg/dL — ABNORMAL HIGH (ref <150)
VLDL: 53 mg/dL — ABNORMAL HIGH (ref 0–40)

## 2014-03-17 LAB — POCT GLYCOSYLATED HEMOGLOBIN (HGB A1C): HEMOGLOBIN A1C: 5.4

## 2014-03-17 LAB — TSH: TSH: 1.787 u[IU]/mL (ref 0.350–4.500)

## 2014-03-17 MED ORDER — LISINOPRIL-HYDROCHLOROTHIAZIDE 20-25 MG PO TABS: 1.0000 | ORAL_TABLET | Freq: Every day | ORAL | Status: DC

## 2014-03-17 NOTE — Progress Notes (Signed)
   Subjective:    Patient ID: Ariana Juarez, female    DOB: January 13, 1966, 48 y.o.   MRN: [id number]  HPI Patient presents today for follow up of HTN. She checks her BP at home with her mother's automatic cuff. She has been running 140-150s/80-100. She is not sure how accurate it is.   She sees Dr. Kathryne Eriksson for her gyn care. Had pap 08/2013.  Tolerating medication well. Stopped taking her cholesterol medication because she has been eating more salad. She has had to put her two dogs down recently which increased her depression. Her zoloft was increased to 75 mg. She is sleeping well, but reports that she and her mother have been eating more "comfort foods." Her mother has diabetes and the patient is concerned that her blood sugar may be elevated.   Review of Systems No chest pain, no SOB, no cough, no pedal edema.     Objective:   Physical Exam  Vitals reviewed. Constitutional: She is oriented to person, place, and time. She appears well-developed and well-nourished.  HENT:  Head: Normocephalic and atraumatic.  Eyes: Conjunctivae are normal.  Neck: Normal range of motion. Neck supple.  Cardiovascular: Normal rate, regular rhythm and normal heart sounds.   Pulmonary/Chest: Effort normal and breath sounds normal.  Musculoskeletal: Normal range of motion. She exhibits no edema.  Neurological: She is alert and oriented to person, place, and time.  Skin: Skin is warm and dry.  Psychiatric: She has a normal mood and affect. Her behavior is normal. Judgment and thought content normal.      Assessment & Plan:  1. Obesity, unspecified - Comprehensive metabolic panel - Vit D  25 hydroxy (rtn osteoporosis monitoring) - POCT glycosylated hemoglobin (Hb A1C) - TSH  2. Essential hypertension - CBC - Comprehensive metabolic panel - Vit D  25 hydroxy (rtn osteoporosis monitoring) - lisinopril-hydrochlorothiazide (PRINZIDE,ZESTORETIC) 20-25 MG per tablet; Take 1 tablet by mouth daily.   Dispense: 90 tablet; Refill: 3  3. Hyperlipidemia - Lipid panel  -Encouraged patient to increase her exercise.  -follow up with CPE in 2-3 months- will discuss health maintenance, screening.  Elby Beck, FNP-BC  [facility]  03/17/2014 11:13 AM

## 2014-03-17 NOTE — Patient Instructions (Signed)
Please increase activity- try to move at least 15-20 minutes every day Make an appointment for a complete physical in 2-3 months next door at the appointment center  Hypertension Hypertension, commonly called high blood pressure, is when the force of blood pumping through your arteries is too strong. Your arteries are the blood vessels that carry blood from your heart throughout your body. A blood pressure reading consists of a higher number over a lower number, such as 110/72. The higher number (systolic) is the pressure inside your arteries when your heart pumps. The lower number (diastolic) is the pressure inside your arteries when your heart relaxes. Ideally you want your blood pressure below 120/80. Hypertension forces your heart to work harder to pump blood. Your arteries may become narrow or stiff. Having hypertension puts you at risk for heart disease, stroke, and other problems.  RISK FACTORS Some risk factors for high blood pressure are controllable. Others are not.  Risk factors you cannot control include:   Race. You may be at higher risk if you are African American.  Age. Risk increases with age.  Gender. Men are at higher risk than women before age 45 years. After age 37, women are at higher risk than men. Risk factors you can control include:  Not getting enough exercise or physical activity.  Being overweight.  Getting too much fat, sugar, calories, or salt in your diet.  Drinking too much alcohol. SIGNS AND SYMPTOMS Hypertension does not usually cause signs or symptoms. Extremely high blood pressure (hypertensive crisis) may cause headache, anxiety, shortness of breath, and nosebleed. DIAGNOSIS  To check if you have hypertension, your health care provider will measure your blood pressure while you are seated, with your arm held at the level of your heart. It should be measured at least twice using the same arm. Certain conditions can cause a difference in blood pressure  between your right and left arms. A blood pressure reading that is higher than normal on one occasion does not mean that you need treatment. If one blood pressure reading is high, ask your health care provider about having it checked again. TREATMENT  Treating high blood pressure includes making lifestyle changes and possibly taking medicine. Living a healthy lifestyle can help lower high blood pressure. You may need to change some of your habits. Lifestyle changes may include:  Following the DASH diet. This diet is high in fruits, vegetables, and whole grains. It is low in salt, red meat, and added sugars.  Getting at least 2 hours of brisk physical activity every week.  Losing weight if necessary.  Not smoking.  Limiting alcoholic beverages.  Learning ways to reduce stress. If lifestyle changes are not enough to get your blood pressure under control, your health care provider may prescribe medicine. You may need to take more than one. Work closely with your health care provider to understand the risks and benefits. HOME CARE INSTRUCTIONS  Have your blood pressure rechecked as directed by your health care provider.   Take medicines only as directed by your health care provider. Follow the directions carefully. Blood pressure medicines must be taken as prescribed. The medicine does not work as well when you skip doses. Skipping doses also puts you at risk for problems.   Do not smoke.   Monitor your blood pressure at home as directed by your health care provider. SEEK MEDICAL CARE IF:   You think you are having a reaction to medicines taken.  You have recurrent headaches or  feel dizzy.  You have swelling in your ankles.  You have trouble with your vision. SEEK IMMEDIATE MEDICAL CARE IF:  You develop a severe headache or confusion.  You have unusual weakness, numbness, or feel faint.  You have severe chest or abdominal pain.  You vomit repeatedly.  You have trouble  breathing. MAKE SURE YOU:   Understand these instructions.  Will watch your condition.  Will get help right away if you are not doing well or get worse. Document Released: 06/17/2005 Document Revised: 11/01/2013 Document Reviewed: 04/09/2013 [facility] Patient Information 2015 [place], [place]. This information is not intended to replace advice given to you by your health care provider. Make sure you discuss any questions you have with your health care provider.

## 2014-03-18 LAB — VITAMIN D 25 HYDROXY (VIT D DEFICIENCY, FRACTURES): Vit D, 25-Hydroxy: 33 ng/mL (ref 30–89)

## 2014-03-22 ENCOUNTER — Encounter: Payer: Self-pay | Admitting: Radiology

## 2014-05-03 ENCOUNTER — Other Ambulatory Visit: Payer: Self-pay | Admitting: Family Medicine

## 2014-05-04 NOTE — Telephone Encounter (Signed)
Ariana Juarez, at pt's 03/17/14 OV you discussed pt's sertraline and pt reported that her sertraline was increased to 75 mg. Do you want to give pt RFs at this dose? I changed the sig and # on the RF sent by pharmacy.

## 2014-05-04 NOTE — Telephone Encounter (Signed)
Yes, we'll refill at 75 mg since that is what she told us at last visit.

## 2014-09-02 ENCOUNTER — Encounter: Payer: Self-pay | Admitting: Family Medicine

## 2014-09-02 ENCOUNTER — Ambulatory Visit (INDEPENDENT_AMBULATORY_CARE_PROVIDER_SITE_OTHER): Payer: BLUE CROSS/BLUE SHIELD | Admitting: Family Medicine

## 2014-09-02 VITALS — BP 148/83 | HR 90 | Temp 99.0°F | Resp 16 | Ht 64.5 in | Wt 202.2 lb

## 2014-09-02 DIAGNOSIS — N809 Endometriosis, unspecified: Secondary | ICD-10-CM

## 2014-09-02 DIAGNOSIS — E559 Vitamin D deficiency, unspecified: Secondary | ICD-10-CM

## 2014-09-02 DIAGNOSIS — E785 Hyperlipidemia, unspecified: Secondary | ICD-10-CM | POA: Diagnosis not present

## 2014-09-02 DIAGNOSIS — R5383 Other fatigue: Secondary | ICD-10-CM | POA: Diagnosis not present

## 2014-09-02 DIAGNOSIS — E8881 Metabolic syndrome: Secondary | ICD-10-CM | POA: Diagnosis not present

## 2014-09-02 DIAGNOSIS — F39 Unspecified mood [affective] disorder: Secondary | ICD-10-CM | POA: Diagnosis not present

## 2014-09-02 DIAGNOSIS — Z1211 Encounter for screening for malignant neoplasm of colon: Secondary | ICD-10-CM | POA: Diagnosis not present

## 2014-09-02 DIAGNOSIS — D72829 Elevated white blood cell count, unspecified: Secondary | ICD-10-CM

## 2014-09-02 DIAGNOSIS — D751 Secondary polycythemia: Secondary | ICD-10-CM

## 2014-09-02 DIAGNOSIS — I10 Essential (primary) hypertension: Secondary | ICD-10-CM

## 2014-09-02 LAB — C-REACTIVE PROTEIN: CRP: 0.9 mg/dL — ABNORMAL HIGH (ref <0.60)

## 2014-09-02 LAB — COMPREHENSIVE METABOLIC PANEL
ALT: 16 U/L (ref 0–35)
AST: 14 U/L (ref 0–37)
Albumin: 4.4 g/dL (ref 3.5–5.2)
Alkaline Phosphatase: 61 U/L (ref 39–117)
BUN: 11 mg/dL (ref 6–23)
CHLORIDE: 102 meq/L (ref 96–112)
CO2: 25 mEq/L (ref 19–32)
Calcium: 9.6 mg/dL (ref 8.4–10.5)
Creat: 0.74 mg/dL (ref 0.50–1.10)
GLUCOSE: 84 mg/dL (ref 70–99)
POTASSIUM: 3.7 meq/L (ref 3.5–5.3)
SODIUM: 139 meq/L (ref 135–145)
Total Bilirubin: 0.5 mg/dL (ref 0.2–1.2)
Total Protein: 6.8 g/dL (ref 6.0–8.3)

## 2014-09-02 LAB — LIPID PANEL
Cholesterol: 231 mg/dL — ABNORMAL HIGH (ref 0–200)
HDL: 33 mg/dL — ABNORMAL LOW (ref >=46)
LDL CALC: 148 mg/dL — AB (ref 0–99)
TRIGLYCERIDES: 249 mg/dL — AB (ref <150)
Total CHOL/HDL Ratio: 7 Ratio
VLDL: 50 mg/dL — AB (ref 0–40)

## 2014-09-02 MED ORDER — SERTRALINE HCL 50 MG PO TABS: 75.0000 mg | ORAL_TABLET | Freq: Every day | ORAL | Status: DC

## 2014-09-02 NOTE — Patient Instructions (Signed)
Check out the [facility] - it is a show on [place] but has a book and blog as well - they review common "home remedies" or naturopathic/homeopathic treatments for a huge variety of conditions.  They will look into the science behind it as well as any studies that are done to let you know if it is safe and/or effective.  Metabolic Syndrome, Adult Metabolic syndrome describes a group of risk factors for heart disease and diabetes. This syndrome has other names including Insulin Resistance Syndrome. The more risk factors you have, the higher your risk of having a heart attack, stroke, or developing diabetes. These risk factors include:  High blood sugar.  High blood triglyceride (a fat found in the blood) level.  High blood pressure.  Abdominal obesity (your extra weight is around your waist instead of your hips).  Low levels of high-density lipoprotein, HDL (good blood cholesterol). If you have any three of these risk factors, you have metabolic syndrome. If you have even one of these factors, you should make lifestyle changes to improve your health in order to prevent serious health diseases.  In people with metabolic syndrome, the cells do not respond properly to insulin. This can lead to high levels of glucose in the blood, which can interfere with normal body processes. Eventually, this can cause high blood pressure and higher fat levels in the blood, and inflammation of your blood vessels. The result can be heart disease and stroke.  CAUSES   Eating a diet rich in calories and saturated fat.  Too little physical activity.  Being overweight. Other underlying causes are:  Family history (genetics).  Ethnicity (South Asians are at a higher risk).  Older age (your chances of developing metabolic syndrome are higher as you grow older).  Insulin resistance. SYMPTOMS  By itself, metabolic syndrome has no symptoms. However, you might have symptoms of diabetes (high blood sugar) or high  blood pressure, such as:  Increased thirst, urination, and tiredness.  Dizzy spells.  Dull headaches that are unusual for you.  Blurred vision.  Nosebleeds. DIAGNOSIS  Your caregiver may make a diagnosis of metabolic syndrome if you have at least three of these factors:  If you are overweight mostly around the waist. This means a waistline greater than 40" in men and more than 35" in women. The waistline limits are 31 to 35 inches for women and 37 to 39 inches for men. In those who have certain genetic risk factors, such as having a family history of diabetes or being of Asian descent.  If you have a blood pressure of 130/85 mm Hg or more, or if you are being treated for high blood pressure.  If your blood triglyceride level is 150 mg/dL or more, or you are being treated for high levels of triglyceride.  If the level of HDL in your blood is below 40 mg/dL in men, less than 50 mg/dL in women, or you are receiving treatment for low levels of HDL.  If the level of sugar in your blood is high with fasting blood sugar level of 110 mg/dL or more, or you are under treatment for diabetes. TREATMENT  Your caregiver may have you make lifestyle changes, which may include:  Exercise.  Losing weight.  Maintaining a healthy diet.  Quitting smoking. The lifestyle changes listed above are key in reducing your risk for heart disease and stroke. Medicines may also be prescribed to help your body respond to insulin better and to reduce your blood  pressure and blood fat levels. Aspirin may be recommended to reduce risks of heart disease or stroke.  HOME CARE INSTRUCTIONS   Exercise.  Measure your waist at regular intervals just above the hipbones after you have breathed out.  Maintain a healthy diet.  Eat fruits, such as apples, oranges, and pears.  Eat vegetables.  Eat legumes, such as kidney beans, peas, and lentils.  Eat food rich in soluble fiber, such as whole grain cereal, oatmeal,  and oat bran.  Use olive or safflower oils and avoid saturated fats.  Eat nuts.  Limit the amount of salt you eat or add to food.  Limit the amount of alcohol you drink.  Include fish in your diet, if possible.  Stop smoking if you are a smoker.  Maintain regular follow-up appointments.  Follow your caregiver's advice. SEEK MEDICAL CARE IF:   You feel very tired or fatigued.  You develop excessive thirst.  You pass large quantities of urine.  You are putting on weight around your waist rather than losing weight.  You develop headaches over and over again.  You have off-and-on dizzy spells. SEEK IMMEDIATE MEDICAL CARE IF:   You develop nosebleeds.  You develop sudden blurred vision.  You develop sudden dizzy spells.  You develop chest pains, trouble breathing, or feel an abnormal or irregular heart beat.  You have a fainting episode.  You develop any sudden trouble speaking and/or swallowing.  You develop sudden weakness in one arm and/or one leg. MAKE SURE YOU:   Understand these instructions.  Will watch your condition.  Will get help right away if you are not doing well or get worse. Document Released: 09/24/2007 Document Revised: 11/01/2013 Document Reviewed: 09/24/2007 [facility] Patient Information 2015 [place], [place]. This information is not intended to replace advice given to you by your health care provider. Make sure you discuss any questions you have with your health care provider.   DASH Eating Plan DASH stands for "Dietary Approaches to Stop Hypertension." The DASH eating plan is a healthy eating plan that has been shown to reduce high blood pressure (hypertension). Additional health benefits may include reducing the risk of type 2 diabetes mellitus, heart disease, and stroke. The DASH eating plan may also help with weight loss. WHAT DO I NEED TO KNOW ABOUT THE DASH EATING PLAN? For the DASH eating plan, you will follow these general  guidelines:  Choose foods with a percent daily value for sodium of less than 5% (as listed on the food label).  Use salt-free seasonings or herbs instead of table salt or sea salt.  Check with your health care provider or pharmacist before using salt substitutes.  Eat lower-sodium products, often labeled as "lower sodium" or "no salt added."  Eat fresh foods.  Eat more vegetables, fruits, and low-fat dairy products.  Choose whole grains. Look for the word "whole" as the first word in the ingredient list.  Choose fish and skinless chicken or [place] more often than red meat. Limit fish, poultry, and meat to 6 oz (170 g) each day.  Limit sweets, desserts, sugars, and sugary drinks.  Choose heart-healthy fats.  Limit cheese to 1 oz (28 g) per day.  Eat more home-cooked food and less restaurant, buffet, and fast food.  Limit fried foods.  Cook foods using methods other than frying.  Limit canned vegetables. If you do use them, rinse them well to decrease the sodium.  When eating at a restaurant, ask that your food be prepared with  less salt, or no salt if possible. WHAT FOODS CAN I EAT? Seek help from a dietitian for individual calorie needs. Grains Whole grain or whole wheat bread. Brown rice. Whole grain or whole wheat pasta. Quinoa, bulgur, and whole grain cereals. Low-sodium cereals. Corn or whole wheat flour tortillas. Whole grain cornbread. Whole grain crackers. Low-sodium crackers. Vegetables Fresh or frozen vegetables (raw, steamed, roasted, or grilled). Low-sodium or reduced-sodium tomato and vegetable juices. Low-sodium or reduced-sodium tomato sauce and paste. Low-sodium or reduced-sodium canned vegetables.  Fruits All fresh, canned (in natural juice), or frozen fruits. Meat and Other Protein Products Ground beef (85% or leaner), grass-fed beef, or beef trimmed of fat. Skinless chicken or [place]. Ground chicken or [place]. Pork trimmed of fat. All fish and seafood.  Eggs. Dried beans, peas, or lentils. Unsalted nuts and seeds. Unsalted canned beans. Dairy Low-fat dairy products, such as skim or 1% milk, 2% or reduced-fat cheeses, low-fat ricotta or cottage cheese, or plain low-fat yogurt. Low-sodium or reduced-sodium cheeses. Fats and Oils Tub margarines without trans fats. Light or reduced-fat mayonnaise and salad dressings (reduced sodium). Avocado. Safflower, olive, or canola oils. Natural peanut or almond butter. Other Unsalted popcorn and pretzels. The items listed above may not be a complete list of recommended foods or beverages. Contact your dietitian for more options. WHAT FOODS ARE NOT RECOMMENDED? Grains White bread. White pasta. White rice. Refined cornbread. Bagels and croissants. Crackers that contain trans fat. Vegetables Creamed or fried vegetables. Vegetables in a cheese sauce. Regular canned vegetables. Regular canned tomato sauce and paste. Regular tomato and vegetable juices. Fruits Dried fruits. Canned fruit in light or heavy syrup. Fruit juice. Meat and Other Protein Products Fatty cuts of meat. Ribs, chicken wings, bacon, sausage, bologna, salami, chitterlings, fatback, hot dogs, bratwurst, and packaged luncheon meats. Salted nuts and seeds. Canned beans with salt. Dairy Whole or 2% milk, cream, half-and-half, and cream cheese. Whole-fat or sweetened yogurt. Full-fat cheeses or blue cheese. Nondairy creamers and whipped toppings. Processed cheese, cheese spreads, or cheese curds. Condiments Onion and garlic salt, seasoned salt, table salt, and sea salt. Canned and packaged gravies. Worcestershire sauce. Tartar sauce. Barbecue sauce. Teriyaki sauce. Soy sauce, including reduced sodium. Steak sauce. Fish sauce. Oyster sauce. Cocktail sauce. Horseradish. Ketchup and mustard. Meat flavorings and tenderizers. Bouillon cubes. Hot sauce. Tabasco sauce. Marinades. Taco seasonings. Relishes. Fats and Oils Butter, stick margarine, lard,  shortening, ghee, and bacon fat. Coconut, palm kernel, or palm oils. Regular salad dressings. Other Pickles and olives. Salted popcorn and pretzels. The items listed above may not be a complete list of foods and beverages to avoid. Contact your dietitian for more information. WHERE CAN I FIND MORE INFORMATION? National Heart, Lung, and Blood Institute: travelstabloid.com Document Released: 06/06/2011 Document Revised: 11/01/2013 Document Reviewed: 04/21/2013 [facility] Patient Information 2015 [place], [place]. This information is not intended to replace advice given to you by your health care provider. Make sure you discuss any questions you have with your health care provider.   Fat and Cholesterol Control Diet Fat and cholesterol levels in your blood and organs are influenced by your diet. High levels of fat and cholesterol may lead to diseases of the heart, small and large blood vessels, gallbladder, liver, and pancreas. CONTROLLING FAT AND CHOLESTEROL WITH DIET Although exercise and lifestyle factors are important, your diet is key. That is because certain foods are known to raise cholesterol and others to lower it. The goal is to balance foods for their effect on cholesterol and more  importantly, to replace saturated and trans fat with other types of fat, such as monounsaturated fat, polyunsaturated fat, and omega-3 fatty acids. On average, a person should consume no more than 15 to 17 g of saturated fat daily. Saturated and trans fats are considered "bad" fats, and they will raise LDL cholesterol. Saturated fats are primarily found in animal products such as meats, butter, and cream. However, that does not mean you need to give up all your favorite foods. Today, there are good tasting, low-fat, low-cholesterol substitutes for most of the things you like to eat. Choose low-fat or nonfat alternatives. Choose round or loin cuts of red meat. These types of cuts are  lowest in fat and cholesterol. Chicken (without the skin), fish, veal, and ground [place] breast are great choices. Eliminate fatty meats, such as hot dogs and salami. Even shellfish have little or no saturated fat. Have a 3 oz (85 g) portion when you eat lean meat, poultry, or fish. Trans fats are also called "partially hydrogenated oils." They are oils that have been scientifically manipulated so that they are solid at room temperature resulting in a longer shelf life and improved taste and texture of foods in which they are added. Trans fats are found in stick margarine, some tub margarines, cookies, crackers, and baked goods.  When baking and cooking, oils are a great substitute for butter. The monounsaturated oils are especially beneficial since it is believed they lower LDL and raise HDL. The oils you should avoid entirely are saturated tropical oils, such as coconut and palm.  Remember to eat a lot from food groups that are naturally free of saturated and trans fat, including fish, fruit, vegetables, beans, grains (barley, rice, couscous, bulgur wheat), and pasta (without cream sauces).  IDENTIFYING FOODS THAT LOWER FAT AND CHOLESTEROL  Soluble fiber may lower your cholesterol. This type of fiber is found in fruits such as apples, vegetables such as broccoli, potatoes, and carrots, legumes such as beans, peas, and lentils, and grains such as barley. Foods fortified with plant sterols (phytosterol) may also lower cholesterol. You should eat at least 2 g per day of these foods for a cholesterol lowering effect.  Read package labels to identify low-saturated fats, trans fat free, and low-fat foods at the supermarket. Select cheeses that have only 2 to 3 g saturated fat per ounce. Use a heart-healthy tub margarine that is free of trans fats or partially hydrogenated oil. When buying baked goods (cookies, crackers), avoid partially hydrogenated oils. Breads and muffins should be made from whole grains  (whole-wheat or whole oat flour, instead of "flour" or "enriched flour"). Buy non-creamy canned soups with reduced salt and no added fats.  FOOD PREPARATION TECHNIQUES  Never deep-fry. If you must fry, either stir-fry, which uses very little fat, or use non-stick cooking sprays. When possible, broil, bake, or roast meats, and steam vegetables. Instead of putting butter or margarine on vegetables, use lemon and herbs, applesauce, and cinnamon (for squash and sweet potatoes). Use nonfat yogurt, salsa, and low-fat dressings for salads.  LOW-SATURATED FAT / LOW-FAT FOOD SUBSTITUTES Meats / Saturated Fat (g)  Avoid: Steak, marbled (3 oz/85 g) / 11 g  Choose: Steak, lean (3 oz/85 g) / 4 g  Avoid: Hamburger (3 oz/85 g) / 7 g  Choose: Hamburger, lean (3 oz/85 g) / 5 g  Avoid: Ham (3 oz/85 g) / 6 g  Choose: Ham, lean cut (3 oz/85 g) / 2.4 g  Avoid: Chicken, with skin, dark meat (  3 oz/85 g) / 4 g  Choose: Chicken, skin removed, dark meat (3 oz/85 g) / 2 g  Avoid: Chicken, with skin, light meat (3 oz/85 g) / 2.5 g  Choose: Chicken, skin removed, light meat (3 oz/85 g) / 1 g Dairy / Saturated Fat (g)  Avoid: Whole milk (1 cup) / 5 g  Choose: Low-fat milk, 2% (1 cup) / 3 g  Choose: Low-fat milk, 1% (1 cup) / 1.5 g  Choose: Skim milk (1 cup) / 0.3 g  Avoid: Hard cheese (1 oz/28 g) / 6 g  Choose: Skim milk cheese (1 oz/28 g) / 2 to 3 g  Avoid: Cottage cheese, 4% fat (1 cup) / 6.5 g  Choose: Low-fat cottage cheese, 1% fat (1 cup) / 1.5 g  Avoid: Ice cream (1 cup) / 9 g  Choose: Sherbet (1 cup) / 2.5 g  Choose: Nonfat frozen yogurt (1 cup) / 0.3 g  Choose: Frozen fruit bar / trace  Avoid: Whipped cream (1 tbs) / 3.5 g  Choose: Nondairy whipped topping (1 tbs) / 1 g Condiments / Saturated Fat (g)  Avoid: Mayonnaise (1 tbs) / 2 g  Choose: Low-fat mayonnaise (1 tbs) / 1 g  Avoid: Butter (1 tbs) / 7 g  Choose: Extra light margarine (1 tbs) / 1 g  Avoid: Coconut oil (1 tbs)  / 11.8 g  Choose: Olive oil (1 tbs) / 1.8 g  Choose: Corn oil (1 tbs) / 1.7 g  Choose: Safflower oil (1 tbs) / 1.2 g  Choose: Sunflower oil (1 tbs) / 1.4 g  Choose: Soybean oil (1 tbs) / 2.4 g  Choose: Canola oil (1 tbs) / 1 g Document Released: 06/17/2005 Document Revised: 10/12/2012 Document Reviewed: 09/15/2013 [employer] Patient Information 2015 [place], [place]. This information is not intended to replace advice given to you by your health care provider. Make sure you discuss any questions you have with your health care provider. Managing Your High Blood Pressure Blood pressure is a measurement of how forceful your blood is pressing against the walls of the arteries. Arteries are muscular tubes within the circulatory system. Blood pressure does not stay the same. Blood pressure rises when you are active, excited, or nervous; and it lowers during sleep and relaxation. If the numbers measuring your blood pressure stay above normal most of the time, you are at risk for health problems. High blood pressure (hypertension) is a Henningsen-term (chronic) condition in which blood pressure is elevated. A blood pressure reading is recorded as two numbers, such as 120 over 80 (or 120/80). The first, higher number is called the systolic pressure. It is a measure of the pressure in your arteries as the heart beats. The second, lower number is called the diastolic pressure. It is a measure of the pressure in your arteries as the heart relaxes between beats.  Keeping your blood pressure in a normal range is important to your overall health and prevention of health problems, such as heart disease and stroke. When your blood pressure is uncontrolled, your heart has to work harder than normal. High blood pressure is a very common condition in adults because blood pressure tends to rise with age. Men and women are equally likely to have hypertension but at different times in life. Before age 55, men are more likely to  have hypertension. After 51 years of age, women are more likely to have it. Hypertension is especially common in African Americans. This condition often has no signs or  symptoms. The cause of the condition is usually not known. Your caregiver can help you come up with a plan to keep your blood pressure in a normal, healthy range. BLOOD PRESSURE STAGES Blood pressure is classified into four stages: normal, prehypertension, stage 1, and stage 2. Your blood pressure reading will be used to determine what type of treatment, if any, is necessary. Appropriate treatment options are tied to these four stages:  Normal  Systolic pressure (mm Hg): below 120.  Diastolic pressure (mm Hg): below 80. Prehypertension  Systolic pressure (mm Hg): 120 to 139.  Diastolic pressure (mm Hg): 80 to 89. Stage1  Systolic pressure (mm Hg): 140 to 159.  Diastolic pressure (mm Hg): 90 to 99. Stage2  Systolic pressure (mm Hg): 160 or above.  Diastolic pressure (mm Hg): 100 or above. RISKS RELATED TO HIGH BLOOD PRESSURE Managing your blood pressure is an important responsibility. Uncontrolled high blood pressure can lead to:  A heart attack.  A stroke.  A weakened blood vessel (aneurysm).  Heart failure.  Kidney damage.  Eye damage.  Metabolic syndrome.  Memory and concentration problems. HOW TO MANAGE YOUR BLOOD PRESSURE Blood pressure can be managed effectively with lifestyle changes and medicines (if needed). Your caregiver will help you come up with a plan to bring your blood pressure within a normal range. Your plan should include the following: Education  Read all information provided by your caregivers about how to control blood pressure.  Educate yourself on the latest guidelines and treatment recommendations. New research is always being done to further define the risks and treatments for high blood pressure. Lifestylechanges  Control your weight.  Avoid smoking.  Stay physically  active.  Reduce the amount of salt in your diet.  Reduce stress.  Control any chronic conditions, such as high cholesterol or diabetes.  Reduce your alcohol intake. Medicines  Several medicines (antihypertensive medicines) are available, if needed, to bring blood pressure within a normal range. Communication  Review all the medicines you take with your caregiver because there may be side effects or interactions.  Talk with your caregiver about your diet, exercise habits, and other lifestyle factors that may be contributing to high blood pressure.  See your caregiver regularly. Your caregiver can help you create and adjust your plan for managing high blood pressure. RECOMMENDATIONS FOR TREATMENT AND FOLLOW-UP  The following recommendations are based on current guidelines for managing high blood pressure in nonpregnant adults. Use these recommendations to identify the proper follow-up period or treatment option based on your blood pressure reading. You can discuss these options with your caregiver.  Systolic pressure of 638 to 466 or diastolic pressure of 80 to 89: Follow up with your caregiver as directed.  Systolic pressure of 599 to 357 or diastolic pressure of 90 to 100: Follow up with your caregiver within 2 months.  Systolic pressure above 017 or diastolic pressure above 793: Follow up with your caregiver within 1 month.  Systolic pressure above 903 or diastolic pressure above 009: Consider antihypertensive therapy; follow up with your caregiver within 1 week.  Systolic pressure above 233 or diastolic pressure above 007: Begin antihypertensive therapy; follow up with your caregiver within 1 week. Document Released: 03/11/2012 Document Reviewed: 03/11/2012 [facility] Patient Information 2015 [place]. This information is not intended to replace advice given to you by your health care provider. Make sure you discuss any questions you have with your health care  provider. Hypertension Hypertension, commonly called high blood pressure, is when the  force of blood pumping through your arteries is too strong. Your arteries are the blood vessels that carry blood from your heart throughout your body. A blood pressure reading consists of a higher number over a lower number, such as 110/72. The higher number (systolic) is the pressure inside your arteries when your heart pumps. The lower number (diastolic) is the pressure inside your arteries when your heart relaxes. Ideally you want your blood pressure below 120/80. Hypertension forces your heart to work harder to pump blood. Your arteries may become narrow or stiff. Having hypertension puts you at risk for heart disease, stroke, and other problems.  RISK FACTORS Some risk factors for high blood pressure are controllable. Others are not.  Risk factors you cannot control include:   Race. You may be at higher risk if you are African American.  Age. Risk increases with age.  Gender. Men are at higher risk than women before age 85 years. After age 95, women are at higher risk than men. Risk factors you can control include:  Not getting enough exercise or physical activity.  Being overweight.  Getting too much fat, sugar, calories, or salt in your diet.  Drinking too much alcohol. SIGNS AND SYMPTOMS Hypertension does not usually cause signs or symptoms. Extremely high blood pressure (hypertensive crisis) may cause headache, anxiety, shortness of breath, and nosebleed. DIAGNOSIS  To check if you have hypertension, your health care provider will measure your blood pressure while you are seated, with your arm held at the level of your heart. It should be measured at least twice using the same arm. Certain conditions can cause a difference in blood pressure between your right and left arms. A blood pressure reading that is higher than normal on one occasion does not mean that you need treatment. If one blood  pressure reading is high, ask your health care provider about having it checked again. TREATMENT  Treating high blood pressure includes making lifestyle changes and possibly taking medicine. Living a healthy lifestyle can help lower high blood pressure. You may need to change some of your habits. Lifestyle changes may include:  Following the DASH diet. This diet is high in fruits, vegetables, and whole grains. It is low in salt, red meat, and added sugars.  Getting at least 2 hours of brisk physical activity every week.  Losing weight if necessary.  Not smoking.  Limiting alcoholic beverages.  Learning ways to reduce stress. If lifestyle changes are not enough to get your blood pressure under control, your health care provider may prescribe medicine. You may need to take more than one. Work closely with your health care provider to understand the risks and benefits. HOME CARE INSTRUCTIONS  Have your blood pressure rechecked as directed by your health care provider.   Take medicines only as directed by your health care provider. Follow the directions carefully. Blood pressure medicines must be taken as prescribed. The medicine does not work as well when you skip doses. Skipping doses also puts you at risk for problems.   Do not smoke.   Monitor your blood pressure at home as directed by your health care provider. SEEK MEDICAL CARE IF:   You think you are having a reaction to medicines taken.  You have recurrent headaches or feel dizzy.  You have swelling in your ankles.  You have trouble with your vision. SEEK IMMEDIATE MEDICAL CARE IF:  You develop a severe headache or confusion.  You have unusual weakness, numbness, or feel faint.  You have severe chest or abdominal pain.  You vomit repeatedly.  You have trouble breathing. MAKE SURE YOU:   Understand these instructions.  Will watch your condition.  Will get help right away if you are not doing well or get  worse. Document Released: 06/17/2005 Document Revised: 11/01/2013 Document Reviewed: 04/09/2013 [facility] Patient Information 2015 [place], [place]. This information is not intended to replace advice given to you by your health care provider. Make sure you discuss any questions you have with your health care provider.

## 2014-09-03 LAB — CBC WITH DIFFERENTIAL/PLATELET
BASOS ABS: 0 10*3/uL (ref 0.0–0.1)
Basophils Relative: 0 % (ref 0–1)
EOS ABS: 0.1 10*3/uL (ref 0.0–0.7)
EOS PCT: 1 % (ref 0–5)
HCT: 48.3 % — ABNORMAL HIGH (ref 36.0–46.0)
Hemoglobin: 16 g/dL — ABNORMAL HIGH (ref 12.0–15.0)
Lymphocytes Relative: 20 % (ref 12–46)
Lymphs Abs: 2.5 10*3/uL (ref 0.7–4.0)
MCH: 28 pg (ref 26.0–34.0)
MCHC: 33.1 g/dL (ref 30.0–36.0)
MCV: 84.6 fL (ref 78.0–100.0)
MONOS PCT: 6 % (ref 3–12)
MPV: 10.6 fL (ref 8.6–12.4)
Monocytes Absolute: 0.7 10*3/uL (ref 0.1–1.0)
NEUTROS PCT: 73 % (ref 43–77)
Neutro Abs: 9.1 10*3/uL — ABNORMAL HIGH (ref 1.7–7.7)
Platelets: 383 10*3/uL (ref 150–400)
RBC: 5.71 MIL/uL — ABNORMAL HIGH (ref 3.87–5.11)
RDW: 14.1 % (ref 11.5–15.5)
WBC: 12.4 10*3/uL — ABNORMAL HIGH (ref 4.0–10.5)

## 2014-09-03 LAB — TSH: TSH: 1.749 u[IU]/mL (ref 0.350–4.500)

## 2014-09-03 LAB — FERRITIN: Ferritin: 40 ng/mL (ref 10–291)

## 2014-09-03 LAB — VITAMIN B12: Vitamin B-12: 481 pg/mL (ref 211–911)

## 2014-09-03 LAB — VITAMIN D 25 HYDROXY (VIT D DEFICIENCY, FRACTURES): VIT D 25 HYDROXY: 13 ng/mL — AB (ref 30–100)

## 2014-09-03 LAB — SEDIMENTATION RATE: Sed Rate: 4 mm/hr (ref 0–20)

## 2014-09-04 ENCOUNTER — Encounter: Payer: Self-pay | Admitting: Family Medicine

## 2014-09-04 DIAGNOSIS — N809 Endometriosis, unspecified: Secondary | ICD-10-CM | POA: Insufficient documentation

## 2014-09-04 DIAGNOSIS — E559 Vitamin D deficiency, unspecified: Secondary | ICD-10-CM | POA: Insufficient documentation

## 2014-09-04 DIAGNOSIS — D72829 Elevated white blood cell count, unspecified: Secondary | ICD-10-CM | POA: Insufficient documentation

## 2014-09-04 DIAGNOSIS — E8881 Metabolic syndrome: Secondary | ICD-10-CM | POA: Insufficient documentation

## 2014-09-04 MED ORDER — ERGOCALCIFEROL 1.25 MG (50000 UT) PO CAPS: 50000.0000 [IU] | ORAL_CAPSULE | ORAL | Status: DC

## 2014-09-04 NOTE — Progress Notes (Deleted)
Subjective:    Patient ID: Ariana Juarez, female    DOB: 04-26-1966, 49 y.o.   MRN: [id number]  This chart was scribed for Shawnee Knapp, MD by Stephania Fragmin, ED Scribe. This patient was seen in room  and the patient's care was started at 11:13 AM.   Hypertension Pertinent negatives include no chest pain or shortness of breath.  Hyperlipidemia Associated symptoms include myalgias (Patient reports as a side effect to Depo-Provera injections). Pertinent negatives include no chest pain or shortness of breath.    HPI Comments: Ariana Juarez is a 49 y.o. female with a history of hypertension, hyperlipidemia, and endometriosis who presents to the [facility] in order to establish care.  Chart Review: Last seen in the clinic 6 months prior by [name]. She was checking her BP at home and was moderately elevated. She was continued on Lisinopril HCTZ 20-25 and advised to increase exercise. Was also advised to schedule an appointment for a complete physical. She receives gyn care from Dr. Orvan Seen. She has been on Zoloft for depression, which was increased last year. At that time, she was noted to have a cholesterol panel with elevated triglycerides and low HDL, giving patient metabolic syndrome, although there were no signs of insulin resistance at that time.   ---   Gynecological: She usually had spotty bleeding for the last 2 years due to endometriosis, but she just started menses. She was started on Depo-Provera to try to induce menopause. However, she complains of associated tiredness, generalized myalgias, and mental confusion with the Depo-Provera injection, which has been significantly alleviated by an at-home tonic of 1 tablespoon Apple Cider Vinegar and 1 tablespoon of honey daily, which increases energy and alertness. Patient sees Dr. Orvan Seen for gynecological care, and had a mammogram in 2015 that was normal.  Hyperlipidemia: Patient states she took pravastatin for a short period but  stopped because it felt "awkward." She adds that she stopped because she was lazy, and also because she believed the apple cider vinegar was decreasing her LDL. She was also told by one provider that there is no evidence that pravastatin decreases the risk of CVA. Patient states she is willing to start pravastatin again if her cholesterol labs are high. She denies tiredness, achiness, or mental confusion with the pravastatin.   Hypertension: Patient has been using her mother's blood pressure machine, but she states the cuff hurts her. She has had high blood pressure readings recently, and has been advised to stop using her mother's blood pressure machine due to questionable accuracy.  Health Maintenance/Social History: In addition to the home tonic of apple cider vinegar and honey (see Gynecological), patient had been taking a B-complex vitamin for skin cracks around her lips, which helped for a while, but she stopped. She denies taking any other vitamins or supplement, including calcium, but has been meaning to start taking more calcium. Patient states she doesn't exercise because she generally feels tired coming home from work. 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  Depression: She reports doing well with Zoloft. She tried decreasing her dosage to 50 mg, but reports doing better with Zoloft 75 mg. She denies seeing a psychiatrist or therapist for this.   Visual Health: Patient sees Dr. Venetia Maxon for eye care.    Review of Systems  Constitutional: Positive for fatigue. Negative for fever and chills.  HENT: Negative for rhinorrhea and sore throat.   Eyes: Negative for visual  disturbance.  Respiratory: Negative for cough and shortness of breath.   Cardiovascular: Negative for chest pain and leg swelling.  Gastrointestinal: Negative for nausea, vomiting, abdominal pain and diarrhea.  Genitourinary: Positive for menstrual problem. Negative for dysuria.    Musculoskeletal: Positive for myalgias (Patient reports as a side effect to Depo-Provera injections). Negative for back pain.  Skin: Positive for rash.  Neurological: Negative for seizures and syncope.  Hematological: Does not bruise/bleed easily.  Psychiatric/Behavioral: Positive for dysphoric mood.       Objective:   Patient Vitals for the past 24 hrs:  BP Temp Temp src Pulse Resp SpO2 Height Weight  09/02/14 1043 (!) 148/83 mmHg 99 F (37.2 C) Oral 90 16 97 % 5' 4.5" (1.638 m) 202 lb 3.2 oz (91.717 kg)     Physical Exam  Constitutional: She is oriented to person, place, and time. She appears well-developed and well-nourished. No distress.  HENT:  Head: Normocephalic and atraumatic.  Eyes: Conjunctivae and EOM are normal.  Neck: Neck supple. No tracheal deviation present.  Cardiovascular: Normal rate, regular rhythm, S1 normal and S2 normal.   No murmur heard. Pulmonary/Chest: Effort normal. No respiratory distress.  Musculoskeletal: Normal range of motion.  Neurological: She is alert and oriented to person, place, and time.  Skin: Skin is warm and dry.  Psychiatric: She has a normal mood and affect. Her behavior is normal.  Nursing note and vitals reviewed.      Assessment & Plan:   Metabolic syndrome - Plan: Comprehensive metabolic panel, Pathologist smear review, CANCELED: Save smear  Hyperlipidemia - Plan: Comprehensive metabolic panel, Lipid panel, Pathologist smear review, CANCELED: Save smear  Episodic mood disorder - Plan: Pathologist smear review, CANCELED: Save smear  Leukocytosis - Plan: CBC with Differential/Platelet, Sedimentation Rate, C-reactive protein, Ferritin, Pathologist smear review, CANCELED: Save smear, CANCELED: Save smear  Endometriosis - Plan: CBC with Differential/Platelet, Pathologist smear review, CANCELED: Save smear - followed by gynecology  Other fatigue - Plan: Vitamin B12, Vit D  25 hydroxy (rtn osteoporosis monitoring), Comprehensive  metabolic panel, TSH, Pathologist smear review, CANCELED: Save smear, CANCELED: Save smear  Colon cancer screening - Plan: Ambulatory referral to Gastroenterology - for initial screening colonoscopy  Meds ordered this encounter  Medications  . sertraline (ZOLOFT) 50 MG tablet    Sig: Take 1.5 tablets (75 mg total) by mouth daily.    Dispense:  135 tablet    Refill:  3    I personally performed the services described in this documentation, which was scribed in my presence. The recorded information has been reviewed and considered, and addended by me as needed.  Delman Cheadle, MD MPH   Results for orders placed or performed in visit on 09/02/14  CBC with Differential/Platelet  Result Value Ref Range   WBC 12.4 (H) 4.0 - 10.5 K/uL   RBC 5.71 (H) 3.87 - 5.11 MIL/uL   Hemoglobin 16.0 (H) 12.0 - 15.0 g/dL   HCT 48.3 (H) 36.0 - 46.0 %   MCV 84.6 78.0 - 100.0 fL   MCH 28.0 26.0 - 34.0 pg   MCHC 33.1 30.0 - 36.0 g/dL   RDW 14.1 11.5 - 15.5 %   Platelets 383 150 - 400 K/uL   MPV 10.6 8.6 - 12.4 fL   Neutrophils Relative % 73 43 - 77 %   Neutro Abs 9.1 (H) 1.7 - 7.7 K/uL   Lymphocytes Relative 20 12 - 46 %   Lymphs Abs 2.5 0.7 - 4.0 K/uL   Monocytes  Relative 6 3 - 12 %   Monocytes Absolute 0.7 0.1 - 1.0 K/uL   Eosinophils Relative 1 0 - 5 %   Eosinophils Absolute 0.1 0.0 - 0.7 K/uL   Basophils Relative 0 0 - 1 %   Basophils Absolute 0.0 0.0 - 0.1 K/uL   Smear Review Criteria for review not met   Vitamin B12  Result Value Ref Range   Vitamin B-12 481 211 - 911 pg/mL  Vit D  25 hydroxy (rtn osteoporosis monitoring)  Result Value Ref Range   Vit D, 25-Hydroxy 13 (L) 30 - 100 ng/mL  Comprehensive metabolic panel  Result Value Ref Range   Sodium 139 135 - 145 mEq/L   Potassium 3.7 3.5 - 5.3 mEq/L   Chloride 102 96 - 112 mEq/L   CO2 25 19 - 32 mEq/L   Glucose, Bld 84 70 - 99 mg/dL   BUN 11 6 - 23 mg/dL   Creat 0.74 0.50 - 1.10 mg/dL   Total Bilirubin 0.5 0.2 - 1.2 mg/dL   Alkaline  Phosphatase 61 39 - 117 U/L   AST 14 0 - 37 U/L   ALT 16 0 - 35 U/L   Total Protein 6.8 6.0 - 8.3 g/dL   Albumin 4.4 3.5 - 5.2 g/dL   Calcium 9.6 8.4 - 10.5 mg/dL  TSH  Result Value Ref Range   TSH 1.749 0.350 - 4.500 uIU/mL  Lipid panel  Result Value Ref Range   Cholesterol 231 (H) 0 - 200 mg/dL   Triglycerides 249 (H) <150 mg/dL   HDL 33 (L) >=46 mg/dL   Total CHOL/HDL Ratio 7.0 Ratio   VLDL 50 (H) 0 - 40 mg/dL   LDL Cholesterol 148 (H) 0 - 99 mg/dL  Sedimentation Rate  Result Value Ref Range   Sed Rate 4 0 - 20 mm/hr  C-reactive protein  Result Value Ref Range   CRP 0.9 (H) <0.60 mg/dL  Ferritin  Result Value Ref Range   Ferritin 40 10 - 291 ng/mL

## 2014-09-05 LAB — PATHOLOGIST SMEAR REVIEW

## 2014-09-05 NOTE — Progress Notes (Signed)
Subjective:    Patient ID: Ariana Juarez, female    DOB: Apr 18, 1966, 49 y.o.   MRN: [id number]  This chart was scribed for Shawnee Knapp, MD by Stephania Fragmin, ED Scribe. This patient was seen in room  and the patient's care was started at 11:13 AM.   Chief Complaint  Patient presents with  . Establish Care    needs blood work  . Hypertension  . Hyperlipidemia    Hypertension Pertinent negatives include no chest pain or shortness of breath.  Hyperlipidemia Associated symptoms include myalgias (Patient reports as a side effect to Depo-Provera injections). Pertinent negatives include no chest pain or shortness of breath.    HPI Comments: Ariana Juarez is a 49 y.o. female with a history of hypertension, hyperlipidemia, and endometriosis who presents to the [facility] in order to establish care.  Chart Review: Last seen in the clinic 6 months prior by [name]. She was checking her BP at home and was moderately elevated. She was continued on Lisinopril HCTZ 20-25 and advised to increase exercise. Was also advised to schedule an appointment for a complete physical. She receives gyn care from Dr. Orvan Seen. She has been on Zoloft for depression, which was increased last year. At that time, she was noted to have a cholesterol panel with elevated triglycerides and low HDL, giving patient metabolic syndrome, although there were no signs of insulin resistance at that time.   ---   Gynecological: She usually had spotty bleeding for the last 2 years due to endometriosis, but she just started menses. She was started on Depo-Provera to try to induce menopause. However, she complains of associated tiredness, generalized myalgias, and mental confusion with the Depo-Provera injection, which has been significantly alleviated by an at-home tonic of 1 tablespoon Apple Cider Vinegar and 1 tablespoon of honey daily, which increases energy and alertness. Patient sees Dr. Orvan Seen for gynecological care,  and had a mammogram in 2015 that was normal.  Hyperlipidemia: Patient states she took pravastatin for a short period but stopped because it felt "awkward." She adds that she stopped because she was lazy, and also because she believed the apple cider vinegar was decreasing her LDL. She was also told by one provider that there is no evidence that pravastatin decreases the risk of CVA. Patient states she is willing to start pravastatin again if her cholesterol labs are high. She denies tiredness, achiness, or mental confusion with the pravastatin.   Hypertension: Patient has been using her mother's blood pressure machine, but she states the cuff hurts her. She has had high blood pressure readings recently, and has been advised to stop using her mother's blood pressure machine due to questionable accuracy.  Health Maintenance/Social History: In addition to the home tonic of apple cider vinegar and honey (see Gynecological), patient had been taking a B-complex vitamin for skin cracks around her lips, which helped for a while, but she stopped. She denies taking any other vitamins or supplement, including calcium, but has been meaning to start taking more calcium. Patient states she doesn't exercise because she generally feels tired coming home from work. 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  Depression: She reports doing well with Zoloft. She tried decreasing her dosage to 50 mg, but reports doing better with Zoloft 75 mg. She denies seeing a psychiatrist or therapist for this.   Visual Health: Patient sees Dr. Venetia Maxon for eye care.    Past Medical  History  Diagnosis Date  . Hypertension   . Hyperlipidemia   . Depression   . Dysmenorrhea   . Anemia   . Glaucoma   . Shortness of breath     on exertion, ie climbing stairs  . GERD (gastroesophageal reflux disease)    Past Surgical History  Procedure Laterality Date  . Dilation and curettage of uterus    .  Dental surgery      bone grafting  . Laparoscopy N/A 11/12/2012    Procedure: LAPAROSCOPY DIAGNOSTIC;  Surgeon: Marylynn Pearson, MD;  Location: [facility];  Service: Gynecology;  Laterality: N/A;   Current Outpatient Prescriptions on File Prior to Visit  Medication Sig Dispense Refill  . lisinopril-hydrochlorothiazide (PRINZIDE,ZESTORETIC) 20-25 MG per tablet Take 1 tablet by mouth daily. 90 tablet 3  . medroxyPROGESTERone (DEPO-PROVERA) 150 MG/ML injection     . Travoprost, BAK Free, (TRAVATAN) 0.004 % SOLN ophthalmic solution Place 1 drop into the left eye at bedtime.    . Nutritional Supplements (NUTRITIONAL SUPPLEMENT PO) Take 2 oz by mouth daily. VEMMA    . pravastatin (PRAVACHOL) 40 MG tablet TAKE 1 TABLET (40 MG TOTAL) BY MOUTH DAILY. (Patient not taking: Reported on 09/02/2014) 90 tablet 0   No current facility-administered medications on file prior to visit.   Allergies  Allergen Reactions  . Mobic [Meloxicam]     Patient states she "felt her heart beat" while on med   Family History  Problem Relation Age of Onset  . Cancer Mother 64    uterine  . Cancer Father     lung/liver  . Spina bifida Brother     mild  . Crohn's disease Brother 6   History   Social History  . Marital Status: Divorced    Spouse Name: n/a  . Number of Children: 0  . Years of Education: 12   Occupational History  . training assistant [employer]   Social History Main Topics  . Smoking status: Never Smoker   . Smokeless tobacco: Never Used  . Alcohol Use: Yes     Comment: one drink every 2 years  . Drug Use: No  . Sexual Activity:    Partners: Male   Other Topics Concern  . None   Social History Narrative   Lives with her mother and brother, and her maternal uncle.    Review of Systems  Constitutional: Positive for fatigue. Negative for fever and chills.  HENT: Negative for rhinorrhea and sore throat.   Eyes: Negative for visual disturbance.  Respiratory: Negative for cough and shortness  of breath.   Cardiovascular: Negative for chest pain and leg swelling.  Gastrointestinal: Negative for nausea, vomiting, abdominal pain and diarrhea.  Genitourinary: Positive for menstrual problem. Negative for dysuria.  Musculoskeletal: Positive for myalgias (Patient reports as a side effect to Depo-Provera injections). Negative for back pain.  Skin: Positive for rash.  Neurological: Negative for seizures and syncope.  Hematological: Does not bruise/bleed easily.  Psychiatric/Behavioral: Positive for dysphoric mood.       Objective:   Patient Vitals for the past 24 hrs:  BP Temp Temp src Pulse Resp SpO2 Height Weight  09/02/14 1043 (!) 148/83 mmHg 99 F (37.2 C) Oral 90 16 97 % 5' 4.5" (1.638 m) 202 lb 3.2 oz (91.717 kg)     Physical Exam  Constitutional: She is oriented to person, place, and time. She appears well-developed and well-nourished. No distress.  HENT:  Head: Normocephalic and atraumatic.  Eyes: Conjunctivae and EOM are  normal.  Neck: Neck supple. No tracheal deviation present.  Cardiovascular: Normal rate, regular rhythm, S1 normal and S2 normal.   No murmur heard. Pulmonary/Chest: Effort normal. No respiratory distress.  Musculoskeletal: Normal range of motion.  Neurological: She is alert and oriented to person, place, and time.  Skin: Skin is warm and dry.  Psychiatric: She has a normal mood and affect. Her behavior is normal.  Nursing note and vitals reviewed.      Assessment & Plan:   Metabolic syndrome - Plan: Comprehensive metabolic panel, Pathologist smear review, CANCELED: Save smear  Hyperlipidemia - Plan: Comprehensive metabolic panel, Lipid panel, Pathologist smear review, CANCELED: Save smear - recheck - pt has been trying some home remidies such as increasing vinegar in her diet but would be willing to restart pravastatin if needed.  Current lipids increased but 10 yr ASCVD risk on 5.1% so cont tlc and recheck annually.  Episodic mood disorder -  Plan: Pathologist smear review, CANCELED: Save smear  Leukocytosis - Plan: CBC with Differential/Platelet, Sedimentation Rate, C-reactive protein, Ferritin, Pathologist smear review, CANCELED: Save smear, CANCELED: Save smear - unknown etiology - will refer to hematology for further evaluation especially as she has now developed polycythemia as well Of note, prior chart notes document that pt is a Jehovah's witness and refuses blood products.  Endometriosis - Plan: CBC with Differential/Platelet, Pathologist smear review, CANCELED: Save smear - followed by gynecology on Depo-Provera for symptoms control as couldn't tolerate systemic estrogen - has not started perimenopausal time yet.  Other fatigue - Plan: Vitamin B12, Vit D  25 hydroxy (rtn osteoporosis monitoring), Comprehensive metabolic panel, TSH, Pathologist smear review, CANCELED: Save smear, CANCELED: Save smear  Colon cancer screening - Plan: Ambulatory referral to Gastroenterology - for initial screening colonoscopy  Vitamin D deficiency - start high dose replacement once wkly x 6 mos, then otc 1000-2000u/d.  HTN - needs to monitor BP outside of office - uncontrolled today on lisinporil-hctz 20-25 but first visit w/ me - could have white coat HTN - try to check BP 2x/wk and call if >140/90 more than 1/2 the time - could increase lisinopril to 40 and/or add on amlodipine.  Meds ordered this encounter  Medications  . sertraline (ZOLOFT) 50 MG tablet    Sig: Take 1.5 tablets (75 mg total) by mouth daily.    Dispense:  135 tablet    Refill:  3    I personally performed the services described in this documentation, which was scribed in my presence. The recorded information has been reviewed and considered, and addended by me as needed.  Delman Cheadle, MD MPH   Results for orders placed or performed in visit on 09/02/14  CBC with Differential/Platelet  Result Value Ref Range   WBC 12.4 (H) 4.0 - 10.5 K/uL   RBC 5.71 (H) 3.87 - 5.11  MIL/uL   Hemoglobin 16.0 (H) 12.0 - 15.0 g/dL   HCT 48.3 (H) 36.0 - 46.0 %   MCV 84.6 78.0 - 100.0 fL   MCH 28.0 26.0 - 34.0 pg   MCHC 33.1 30.0 - 36.0 g/dL   RDW 14.1 11.5 - 15.5 %   Platelets 383 150 - 400 K/uL   MPV 10.6 8.6 - 12.4 fL   Neutrophils Relative % 73 43 - 77 %   Neutro Abs 9.1 (H) 1.7 - 7.7 K/uL   Lymphocytes Relative 20 12 - 46 %   Lymphs Abs 2.5 0.7 - 4.0 K/uL   Monocytes Relative 6 3 -  12 %   Monocytes Absolute 0.7 0.1 - 1.0 K/uL   Eosinophils Relative 1 0 - 5 %   Eosinophils Absolute 0.1 0.0 - 0.7 K/uL   Basophils Relative 0 0 - 1 %   Basophils Absolute 0.0 0.0 - 0.1 K/uL   Smear Review Criteria for review not met   Vitamin B12  Result Value Ref Range   Vitamin B-12 481 211 - 911 pg/mL  Vit D  25 hydroxy (rtn osteoporosis monitoring)  Result Value Ref Range   Vit D, 25-Hydroxy 13 (L) 30 - 100 ng/mL  Comprehensive metabolic panel  Result Value Ref Range   Sodium 139 135 - 145 mEq/L   Potassium 3.7 3.5 - 5.3 mEq/L   Chloride 102 96 - 112 mEq/L   CO2 25 19 - 32 mEq/L   Glucose, Bld 84 70 - 99 mg/dL   BUN 11 6 - 23 mg/dL   Creat 0.74 0.50 - 1.10 mg/dL   Total Bilirubin 0.5 0.2 - 1.2 mg/dL   Alkaline Phosphatase 61 39 - 117 U/L   AST 14 0 - 37 U/L   ALT 16 0 - 35 U/L   Total Protein 6.8 6.0 - 8.3 g/dL   Albumin 4.4 3.5 - 5.2 g/dL   Calcium 9.6 8.4 - 10.5 mg/dL  TSH  Result Value Ref Range   TSH 1.749 0.350 - 4.500 uIU/mL  Lipid panel  Result Value Ref Range   Cholesterol 231 (H) 0 - 200 mg/dL   Triglycerides 249 (H) <150 mg/dL   HDL 33 (L) >=46 mg/dL   Total CHOL/HDL Ratio 7.0 Ratio   VLDL 50 (H) 0 - 40 mg/dL   LDL Cholesterol 148 (H) 0 - 99 mg/dL  Sedimentation Rate  Result Value Ref Range   Sed Rate 4 0 - 20 mm/hr  C-reactive protein  Result Value Ref Range   CRP 0.9 (H) <0.60 mg/dL  Ferritin  Result Value Ref Range   Ferritin 40 10 - 291 ng/mL

## 2014-09-06 ENCOUNTER — Telehealth: Payer: Self-pay | Admitting: Internal Medicine

## 2014-09-06 NOTE — Telephone Encounter (Signed)
CHART DELIVERED ON 09/06/14.  TG

## 2014-09-13 ENCOUNTER — Other Ambulatory Visit: Payer: Self-pay | Admitting: Obstetrics and Gynecology

## 2014-09-14 LAB — CYTOLOGY - PAP

## 2014-09-27 ENCOUNTER — Encounter: Payer: Self-pay | Admitting: Medical Oncology

## 2014-09-27 ENCOUNTER — Other Ambulatory Visit (HOSPITAL_BASED_OUTPATIENT_CLINIC_OR_DEPARTMENT_OTHER): Payer: BLUE CROSS/BLUE SHIELD

## 2014-09-27 ENCOUNTER — Other Ambulatory Visit: Payer: Self-pay | Admitting: Internal Medicine

## 2014-09-27 ENCOUNTER — Encounter: Payer: Self-pay | Admitting: Internal Medicine

## 2014-09-27 ENCOUNTER — Ambulatory Visit: Payer: BLUE CROSS/BLUE SHIELD

## 2014-09-27 ENCOUNTER — Ambulatory Visit (HOSPITAL_BASED_OUTPATIENT_CLINIC_OR_DEPARTMENT_OTHER): Payer: BLUE CROSS/BLUE SHIELD | Admitting: Internal Medicine

## 2014-09-27 ENCOUNTER — Telehealth: Payer: Self-pay | Admitting: Internal Medicine

## 2014-09-27 VITALS — BP 142/87 | HR 100 | Temp 99.3°F | Resp 18 | Ht 64.5 in | Wt 200.6 lb

## 2014-09-27 DIAGNOSIS — D751 Secondary polycythemia: Secondary | ICD-10-CM

## 2014-09-27 DIAGNOSIS — D72829 Elevated white blood cell count, unspecified: Secondary | ICD-10-CM

## 2014-09-27 LAB — CBC WITH DIFFERENTIAL/PLATELET
BASO%: 0.2 % (ref 0.0–2.0)
Basophils Absolute: 0 10*3/uL (ref 0.0–0.1)
EOS%: 1.1 % (ref 0.0–7.0)
Eosinophils Absolute: 0.2 10*3/uL (ref 0.0–0.5)
HCT: 44.1 % (ref 34.8–46.6)
HEMOGLOBIN: 14.9 g/dL (ref 11.6–15.9)
LYMPH#: 2.7 10*3/uL (ref 0.9–3.3)
LYMPH%: 19.3 % (ref 14.0–49.7)
MCH: 28.6 pg (ref 25.1–34.0)
MCHC: 33.8 g/dL (ref 31.5–36.0)
MCV: 84.6 fL (ref 79.5–101.0)
MONO#: 1 10*3/uL — AB (ref 0.1–0.9)
MONO%: 7 % (ref 0.0–14.0)
NEUT#: 10.2 10*3/uL — ABNORMAL HIGH (ref 1.5–6.5)
NEUT%: 72.4 % (ref 38.4–76.8)
Platelets: 337 10*3/uL (ref 145–400)
RBC: 5.21 10*6/uL (ref 3.70–5.45)
RDW: 13.6 % (ref 11.2–14.5)
WBC: 14.1 10*3/uL — ABNORMAL HIGH (ref 3.9–10.3)

## 2014-09-27 LAB — COMPREHENSIVE METABOLIC PANEL (CC13)
ALBUMIN: 4.1 g/dL (ref 3.5–5.0)
ALK PHOS: 61 U/L (ref 40–150)
ALT: 14 U/L (ref 0–55)
AST: 9 U/L (ref 5–34)
Anion Gap: 15 mEq/L — ABNORMAL HIGH (ref 3–11)
BUN: 12.8 mg/dL (ref 7.0–26.0)
CHLORIDE: 104 meq/L (ref 98–109)
CO2: 24 mEq/L (ref 22–29)
CREATININE: 0.8 mg/dL (ref 0.6–1.1)
Calcium: 9.5 mg/dL (ref 8.4–10.4)
EGFR: 85 mL/min/{1.73_m2} — AB (ref >90)
Glucose: 100 mg/dl (ref 70–140)
POTASSIUM: 3.4 meq/L — AB (ref 3.5–5.1)
Sodium: 142 mEq/L (ref 136–145)
Total Bilirubin: 0.41 mg/dL (ref 0.20–1.20)
Total Protein: 7 g/dL (ref 6.4–8.3)

## 2014-09-27 LAB — IRON AND TIBC CHCC
%SAT: 22 % (ref 21–57)
Iron: 79 ug/dL (ref 41–142)
TIBC: 356 ug/dL (ref 236–444)
UIBC: 278 ug/dL (ref 120–384)

## 2014-09-27 LAB — LACTATE DEHYDROGENASE (CC13): LDH: 148 U/L (ref 125–245)

## 2014-09-27 LAB — FERRITIN CHCC: FERRITIN: 29 ng/mL (ref 9–269)

## 2014-09-27 LAB — TECHNOLOGIST REVIEW

## 2014-09-27 NOTE — Progress Notes (Signed)
[facility] [phone]   [phone]  CONSULT NOTE  REFERRING PHYSICIAN: Dr. Laurey Arrow. Brigitte Pulse.  REASON FOR CONSULTATION:  49 years old white female with persistent leukocytosis and polycythemia  HPI Ariana Juarez is a 49 y.o. female with past medical history significant for depression, hypertension, dyslipidemia, endometriosis, vitamin D deficiency and history of gallbladder stones. The patient was seen recently by her primary care physician for evaluation and she was noted to have elevated white blood count. CBC on 09/02/2014 showed white blood count 12.4, hemoglobin 16.0, hematocrit 48.3 with absolute neutrophil count of 9100 and platelets count 383,000. The patient was referred to me today for evaluation and recommendation regarding her condition. The patient has been doing fine with no specific complaints. She has a history of endometriosis and has been on treatment with Depot Provera every 3 months. She mentions that she has elevated white blood count for several years. Review her records, CBC on 02/12/2008 showed elevated white blood count of 12.8. On 10/22/2011 her white blood count was 13.9 and on 10/30/2012 her white blood count was 12.6. On 03/17/2014 her white blood count was 14.3. The patient denied having any significant complaints today. She has no chest pain, shortness of breath, cough or hemoptysis. She denied having any significant weight loss or night sweats. She has no nausea or vomiting and no fever or chills. Family history significant for a mother with uterine cancer in her 37s, father had lung cancer at age 8 and brother with Crohn's disease. The patient is single and has no children. She works at [employer]. She has no history of smoking, alcohol or drug abuse.  HPI  Past Medical History  Diagnosis Date  . Hypertension   . Hyperlipidemia   . Depression   . Dysmenorrhea   . Anemia   . Glaucoma   . Shortness of breath    on exertion, ie climbing stairs  . GERD (gastroesophageal reflux disease)     Past Surgical History  Procedure Laterality Date  . Dilation and curettage of uterus    . Dental surgery      bone grafting  . Laparoscopy N/A 11/12/2012    Procedure: LAPAROSCOPY DIAGNOSTIC;  Surgeon: Marylynn Pearson, MD;  Location: [facility];  Service: Gynecology;  Laterality: N/A;    Family History  Problem Relation Age of Onset  . Cancer Mother 70    uterine  . Cancer Father     lung/liver  . Spina bifida Brother     mild  . Crohn's disease Brother 9    Social History History  Substance Use Topics  . Smoking status: Never Smoker   . Smokeless tobacco: Never Used  . Alcohol Use: Yes     Comment: one drink every 2 years    Allergies  Allergen Reactions  . Mobic [Meloxicam]     Patient states she "felt her heart beat" while on med    Current Outpatient Prescriptions  Medication Sig Dispense Refill  . ergocalciferol (VITAMIN D2) 50000 UNITS capsule Take 1 capsule (50,000 Units total) by mouth once a week. 4 capsule 2  . lisinopril-hydrochlorothiazide (PRINZIDE,ZESTORETIC) 20-25 MG per tablet Take 1 tablet by mouth daily. 90 tablet 3  . medroxyPROGESTERone (DEPO-PROVERA) 150 MG/ML injection     . Nutritional Supplements (NUTRITIONAL SUPPLEMENT PO) Take 2 oz by mouth daily. VEMMA    . sertraline (ZOLOFT) 50 MG tablet Take 1.5 tablets (75 mg total) by mouth daily. 135 tablet 3  .  Travoprost, BAK Free, (TRAVATAN) 0.004 % SOLN ophthalmic solution Place 1 drop into the left eye at bedtime.     No current facility-administered medications for this visit.    Review of Systems  Constitutional: positive for fatigue Eyes: negative Ears, nose, mouth, throat, and face: negative Respiratory: negative Cardiovascular: negative Gastrointestinal: negative Genitourinary:negative Integument/breast: negative Hematologic/lymphatic: negative Musculoskeletal:negative Neurological:  negative Behavioral/Psych: negative Endocrine: negative Allergic/Immunologic: negative  Physical Exam  VPX:TGGYI, healthy, no distress, well nourished and well developed SKIN: skin color, texture, turgor are normal, no rashes or significant lesions HEAD: Normocephalic, No masses, lesions, tenderness or abnormalities EYES: normal, PERRLA EARS: External ears normal, Canals clear OROPHARYNX:no exudate, no erythema and lips, buccal mucosa, and tongue normal  NECK: supple, no adenopathy, no JVD LYMPH:  no palpable lymphadenopathy, no hepatosplenomegaly BREAST:not examined LUNGS: clear to auscultation , and palpation HEART: regular rate & rhythm, no murmurs and no gallops ABDOMEN:abdomen soft, non-tender, obese, normal bowel sounds and no masses or organomegaly BACK: Back symmetric, no curvature., No CVA tenderness EXTREMITIES:no joint deformities, effusion, or inflammation, no edema, no skin discoloration  NEURO: alert & oriented x 3 with fluent speech, no focal motor/sensory deficits  PERFORMANCE STATUS: ECOG 0  LABORATORY DATA: Lab Results  Component Value Date   WBC 14.1* 09/27/2014   HGB 14.9 09/27/2014   HCT 44.1 09/27/2014   MCV 84.6 09/27/2014   PLT 337 09/27/2014      Chemistry      Component Value Date/Time   NA 142 09/27/2014 1109   NA 139 09/02/2014 1205   NA 137 08/31/2012   K 3.4* 09/27/2014 1109   K 3.7 09/02/2014 1205   CL 102 09/02/2014 1205   CO2 24 09/27/2014 1109   CO2 25 09/02/2014 1205   BUN 12.8 09/27/2014 1109   BUN 11 09/02/2014 1205   BUN 14 08/31/2012   CREATININE 0.8 09/27/2014 1109   CREATININE 0.74 09/02/2014 1205   CREATININE 0.70 10/30/2012 0935   CREATININE 0.8 08/31/2012   GLU 113 08/31/2012      Component Value Date/Time   CALCIUM 9.5 09/27/2014 1109   CALCIUM 9.6 09/02/2014 1205   ALKPHOS 61 09/27/2014 1109   ALKPHOS 61 09/02/2014 1205   AST 9 09/27/2014 1109   AST 14 09/02/2014 1205   ALT 14 09/27/2014 1109   ALT 16  09/02/2014 1205   BILITOT 0.41 09/27/2014 1109   BILITOT 0.5 09/02/2014 1205       RADIOGRAPHIC STUDIES: No results found.  ASSESSMENT: This is a very pleasant 49 years old white female who came today for evaluation of persistent leukocytosis as well as polycythemia found on the recent blood work. The patient has leukocytosis 40 years and most likely reactive in origin secondary to endometriosis and hormonal treatment. Her polycythemia was seen only on one recent blood work.   PLAN: I had a lengthy discussion with the patient today about her condition. I explained to the patient that most likely she has reactive condition causing her polycythemia and leukocytosis but I cannot rule out myeloproliferative disorder at this point. I ordered several studies for evaluation of her condition including repeat CBC, comprehensive metabolic panel, LDH, Barnabas Lister 2 mutation as well as BCR/ABL molecular study and serum erythropoietin in addition to iron study and ferritin. I recommended for the patient to continue on observation for now. I will see her back for follow-up visit in one month with repeat CBC for reevaluation and discussion of the pending lab results and further recommendation regarding her  condition. She was advised to call immediately if she has any concerning symptoms in the interval. The patient voices understanding of current disease status and treatment options and is in agreement with the current care plan.  All questions were answered. The patient knows to call the clinic with any problems, questions or concerns. We can certainly see the patient much sooner if necessary.  Thank you so much for allowing me to participate in the care of [place]. I will continue to follow up the patient with you and assist in her care.  Disclaimer: This note was dictated with voice recognition software. Similar sounding words can inadvertently be transcribed and may not be corrected upon  review.   Yazmeen Woolf K. September 27, 2014, 12:31 PM

## 2014-09-27 NOTE — Telephone Encounter (Signed)
Gave avs & calendar for April. °

## 2014-09-27 NOTE — Progress Notes (Signed)
Checked in new pt with no financial concerns at this time.  Pt has my card for any billing questions or concerns. °

## 2014-09-29 LAB — ERYTHROPOIETIN: ERYTHROPOIETIN: 13.2 m[IU]/mL (ref 2.6–18.5)

## 2014-10-05 ENCOUNTER — Telehealth: Payer: Self-pay | Admitting: Internal Medicine

## 2014-10-05 NOTE — Telephone Encounter (Signed)
spoke with patient and r/s 4.26 appt to 5.3 due to MD on pal....pt ok adn aware of new d.t

## 2014-10-06 ENCOUNTER — Encounter: Payer: Self-pay | Admitting: Internal Medicine

## 2014-10-25 ENCOUNTER — Other Ambulatory Visit: Payer: BLUE CROSS/BLUE SHIELD

## 2014-10-25 ENCOUNTER — Ambulatory Visit: Payer: BLUE CROSS/BLUE SHIELD | Admitting: Internal Medicine

## 2014-10-31 ENCOUNTER — Other Ambulatory Visit: Payer: Self-pay | Admitting: Medical Oncology

## 2014-10-31 ENCOUNTER — Telehealth: Payer: Self-pay | Admitting: *Deleted

## 2014-10-31 NOTE — Telephone Encounter (Addendum)
Patient called to cancel appointment for tomorrow.  Informed her I would transfer call to scheduler to reschedule.  "I have to pay for my first visit before I can schedule another.  Dr. Julien Nordmann didn't think I have a problem and doctors are sending me to so many different doctors I feel like I'm chasing my tail and will just have to get used to having elevated white cell count and don't think I need to reschedule."  Advised her to reschedule because I do not want her to fall through the cracks.  Reports she will be out of town in three weeks and would like an appointment after she returns.  Attempted to transfer to scheduler to reschedule.  Call lost.  Will notify scheduler to call patient.  Return number [phone].

## 2014-10-31 NOTE — Telephone Encounter (Signed)
Opened in error

## 2014-11-01 ENCOUNTER — Other Ambulatory Visit: Payer: BLUE CROSS/BLUE SHIELD

## 2014-11-01 ENCOUNTER — Telehealth: Payer: Self-pay | Admitting: Internal Medicine

## 2014-11-01 ENCOUNTER — Ambulatory Visit: Payer: BLUE CROSS/BLUE SHIELD | Admitting: Internal Medicine

## 2014-11-01 NOTE — Telephone Encounter (Signed)
S/w pt confirming labs/ov per 05/02 POF, mailed schedule to pt... KJ

## 2014-11-29 ENCOUNTER — Telehealth: Payer: Self-pay | Admitting: Internal Medicine

## 2014-11-29 NOTE — Telephone Encounter (Signed)
Returned Advertising account executive. Left message to call back to reschedule appointment.

## 2014-12-01 ENCOUNTER — Ambulatory Visit: Payer: BLUE CROSS/BLUE SHIELD | Admitting: Internal Medicine

## 2014-12-01 ENCOUNTER — Other Ambulatory Visit: Payer: BLUE CROSS/BLUE SHIELD

## 2014-12-11 ENCOUNTER — Other Ambulatory Visit: Payer: Self-pay | Admitting: Family Medicine

## 2015-03-01 NOTE — Progress Notes (Signed)
Subjective:    Patient ID: Ariana Juarez, female    DOB: 1966-04-16, 49 y.o.   MRN: [id number] Chief Complaint  Patient presents with  . Follow-up    6 mos  . Hypertension  . Hyperlipidemia  . Medication Refill    Lisinopril-HCTZ 20-25 mg    HPI  49 yo female that I last say 6 mos prior.  Metabolic syndrome: due to HTN, HPL, and abd obesity.  a1c nml 1 yr prior with nml glucose  Hypertension: Was requested to start monitoring BP outside office as was not at goal on lisinopril-hctz 20-25 but attributed to likely white coat elevation.  Her K was slightly low from the hctz. 130s[facility]s [facility]  Hyperlipidemia: Plan: Lipids 6 mos prior worse but 10 yr ASCVD risk was only 5.1% so advised pt to work on tlc - pt was on pravastatin prior and is willing to restart pravastatin if needed. Has cut down on bacon/sausage and cheese and was started on 1239m of flaxseed tid but trouble complying with the freq.  Episodic mood disorder: doing well on zolofrt  Leukocytosis: Pt saw Dr. MJulien Nordmanndue to persistent leukocytosis/polycythemia.  Pt was reassured that this was likely reactive from endometriosis and Depo-Provera after additional testing was done including erythropoietin, iron studies, LDH, Jack2, and bcl/abl-1 which were normal so pt was advised to follow with watchful waiting.  Smear done at that time did show large & giant platelets at that time though smear 1 mo prior showed nml plts.  Endometriosis: followed by gynecology on Depo-Provera for symptoms control as couldn't tolerate systemic estrogen.   HM - is over due for CPE. Sees Dr AJulien Girtfor her well-woman care and is still getting Depo-Provera there.  At last OV I referred pt for her initial colonoscopy but she never returned call to [facility] to sched.  Vitamin D deficiency -  taking otc vit D3 5000 bid rather than D2 with better result  [facility] started for low pregnenalone about a month  ago and has started feeling better.  Was started on  using probiotic, using DHEA 140m and methylation compete, vit K, turmeric, and micronized pregnenolone.     Work gives flu shots but happy to give today.  Depression screen PHQ 2/9 03/02/2015  Decreased Interest 0  Down, Depressed, Hopeless (No Data)  PHQ - 2 Score 0   Past Medical History  Diagnosis Date  . Hypertension   . Hyperlipidemia   . Depression   . Dysmenorrhea   . Anemia   . Glaucoma   . Shortness of breath     on exertion, ie climbing stairs  . GERD (gastroesophageal reflux disease)    Past Surgical History  Procedure Laterality Date  . Dilation and curettage of uterus    . Dental surgery      bone grafting  . Laparoscopy N/A 11/12/2012    Procedure: LAPAROSCOPY DIAGNOSTIC;  Surgeon: GrMarylynn PearsonMD;  [facility];  Service: Gynecology;  Laterality: N/A;   Current Outpatient Prescriptions on File Prior to Visit  Medication Sig Dispense Refill  . Coenzyme Q10 (COQ10) 100 MG CAPS Take 1 capsule by mouth daily.    . Flaxseed, Linseed, (FLAX SEEDS PO) Take 1 tablet by mouth daily.    . medroxyPROGESTERone (DEPO-PROVERA) 150 MG/ML injection     . sertraline (ZOLOFT) 50 MG tablet Take 1.5 tablets (75 mg total) by mouth daily. 135 tablet 3  . Travoprost, BAK Free, (  TRAVATAN) 0.004 % SOLN ophthalmic solution Place 1 drop into the left eye at bedtime.    . Biotin 1 MG CAPS Take 1 capsule by mouth daily.    . folic acid (FOLVITE) 1 MG tablet Take 1 mg by mouth daily.     No current facility-administered medications on file prior to visit.   Allergies  Allergen Reactions  . Mobic [Meloxicam]     Patient states she "felt her heart beat" while on med   Family History  Problem Relation Age of Onset  . Cancer Mother 86    uterine  . Cancer Father     lung/liver  . Spina bifida Brother     mild  . Crohn's disease Brother 2   Social History   Social History  . Marital Status: Divorced    Spouse Name:  n/a  . Number of Children: 0  . Years of Education: 12   Occupational History  . training assistant [employer]   Social History Main Topics  . Smoking status: Never Smoker   . Smokeless tobacco: Never Used  . Alcohol Use: Yes     Comment: one drink every 2 years  . Drug Use: No  . Sexual Activity:    Partners: Male   Other Topics Concern  . None   Social History Narrative   Lives with her mother and brother, and her maternal uncle.      Review of Systems  Constitutional: Positive for fatigue and unexpected weight change. Negative for chills, diaphoresis, activity change and appetite change.  Respiratory: Negative for cough, chest tightness and shortness of breath.   Cardiovascular: Negative for chest pain and palpitations.  Gastrointestinal: Negative for vomiting, abdominal pain, diarrhea, blood in stool, anal bleeding and rectal pain.  Genitourinary: Positive for menstrual problem and pelvic pain. Negative for vaginal discharge.  Musculoskeletal: Positive for myalgias, joint swelling and arthralgias.  Allergic/Immunologic: Negative for immunocompromised state.  Neurological: Negative for weakness and numbness.  Hematological: Negative for adenopathy.  Psychiatric/Behavioral: Positive for sleep disturbance. Negative for dysphoric mood. The patient is not nervous/anxious.   All other systems reviewed and are negative.        Objective:  BP 140/90 mmHg  Pulse 96  Temp(Src) 99.2 F (37.3 C) (Oral)  Resp 16  Ht _0  (1.626 m)  Wt 201 lb 12.8 oz (91.536 kg)  BMI 34.62 kg/m2  SpO2 96%  LMP 08/30/2014  Physical Exam  Constitutional: She is oriented to person, place, and time. She appears well-developed and well-nourished. No distress.  HENT:  Head: Normocephalic and atraumatic.  Right Ear: External ear normal.  Left Ear: External ear normal.  Eyes: Conjunctivae are normal. No scleral icterus.  Neck: Normal range of motion. Neck supple. No thyromegaly present.    Cardiovascular: Normal rate, regular rhythm, normal heart sounds and intact distal pulses.   Pulmonary/Chest: Effort normal and breath sounds normal. No respiratory distress.  Musculoskeletal: She exhibits no edema.  Lymphadenopathy:    She has no cervical adenopathy.  Neurological: She is alert and oriented to person, place, and time.  Skin: Skin is warm and dry. She is not diaphoretic. No erythema.  Psychiatric: She has a normal mood and affect. Her behavior is normal.          Assessment & Plan:  Pt feels she has gotten good results from supplements recommended by [facility] - she is to return to them in sev wks for additional lab work so pt advised that with  an order I would be happy to sign off on the labs they want so she can have drawn here and run through her health ins  1. Essential hypertension - not checking bp outside office but fine at other provider visits, pt advised to cont monitoring and call if elevated but on max dose of the combo pill lisinopril-hctz 20-25  2. Hypercholesterolemia - has gone off pravastatin since last OV and ASCVD 5.2% so no need to restart at this time despite sig worsening - work on tlc and omega-3 supp.  3. Vitamin D deficiency - [facility]  4. Metabolic syndrome - from HTN, HPL, and abd obesity. hgba1c 5.9 today up from 5.4 last yr  5. Leukocytosis   6. Polycythemia - stable, likely benign per hematology w/u  7. Polypharmacy   8. Hypokalemia - increase K in diet  9. Obesity (BMI 30.0-34.9)   10. Need for prophylactic vaccination and inoculation against influenza   11. Flu vaccine need   12.    Mood d/o - refill zoloft x 1 yr whenever pharmacy sends request  sched next OV for CPE - pt reminded to sched appt for colonoscopy with [facility] asap.  Orders Placed This Encounter  Procedures  . Flu Vaccine QUAD 36+ mos IM  . Comprehensive metabolic panel  . CBC with Differential/Platelet  .  Pathologist smear review  . Lipid panel    Order Specific Question:  Has the patient fasted?    Answer:  Yes  . POCT glucose (manual entry)  . POCT glycosylated hemoglobin (Hb A1C)    Meds ordered this encounter  Medications  . Cholecalciferol (VITAMIN D-3 PO)    Sig: Take by mouth.  . Menaquinone-7 (VITAMIN K2 PO)    Sig: Take by mouth.  . Nutritional Supplements (DHEA PO)    Sig: Take by mouth.  Marland Kitchen OVER THE COUNTER MEDICATION    Sig: Pregnenolone 75 mg taking daily  . OVER THE COUNTER MEDICATION    Sig: Tumeric  500 mg taking daily  . OVER THE COUNTER MEDICATION    Sig: Methylation Complete taking daily  . Probiotic Product (ADVANCED PROBIOTIC 10 PO)    Sig: Take by mouth.  Marland Kitchen lisinopril-hydrochlorothiazide (PRINZIDE,ZESTORETIC) 20-25 MG per tablet    Sig: Take 1 tablet by mouth daily.    Dispense:  90 tablet    Refill:  3    Delman Cheadle, MD MPH  Results for orders placed or performed in visit on 03/02/15  Comprehensive metabolic panel  Result Value Ref Range   Sodium 141 135 - 146 mmol/L   Potassium 4.0 3.5 - 5.3 mmol/L   Chloride 102 98 - 110 mmol/L   CO2 26 20 - 31 mmol/L   Glucose, Bld 116 (H) 65 - 99 mg/dL   BUN 11 7 - 25 mg/dL   Creat 0.77 0.50 - 1.05 mg/dL   Total Bilirubin 0.6 0.2 - 1.2 mg/dL   Alkaline Phosphatase 59 33 - 130 U/L   AST 15 10 - 35 U/L   ALT 23 6 - 29 U/L   Total Protein 7.1 6.1 - 8.1 g/dL   Albumin 4.7 3.6 - 5.1 g/dL   Calcium 10.0 8.6 - 10.4 mg/dL  CBC with Differential/Platelet  Result Value Ref Range   WBC 13.2 (H) 4.0 - 10.5 K/uL   RBC 5.62 (H) 3.87 - 5.11 MIL/uL   Hemoglobin 15.8 (H) 12.0 - 15.0 g/dL   HCT 46.1 (H) 36.0 - 46.0 %  MCV 82.0 78.0 - 100.0 fL   MCH 28.1 26.0 - 34.0 pg   MCHC 34.3 30.0 - 36.0 g/dL   RDW 14.0 11.5 - 15.5 %   Platelets 346 150 - 400 K/uL   MPV 10.8 8.6 - 12.4 fL   Neutrophils Relative % 69 43 - 77 %   Neutro Abs 9.1 (H) 1.7 - 7.7 K/uL   Lymphocytes Relative 23 12 - 46 %   Lymphs Abs 3.0 0.7 - 4.0  K/uL   Monocytes Relative 6 3 - 12 %   Monocytes Absolute 0.8 0.1 - 1.0 K/uL   Eosinophils Relative 2 0 - 5 %   Eosinophils Absolute 0.3 0.0 - 0.7 K/uL   Basophils Relative 0 0 - 1 %   Basophils Absolute 0.0 0.0 - 0.1 K/uL   Smear Review Criteria for review not met   Pathologist smear review  Result Value Ref Range   Path Review SEE NOTE   Lipid panel  Result Value Ref Range   Cholesterol 239 (H) 125 - 200 mg/dL   Triglycerides 221 (H) <150 mg/dL   HDL 32 (L) >=46 mg/dL   Total CHOL/HDL Ratio 7.5 (H) <=5.0 Ratio   VLDL 44 (H) <30 mg/dL   LDL Cholesterol 163 (H) <130 mg/dL  POCT glucose (manual entry)  Result Value Ref Range   POC Glucose 99 70 - 99 mg/dl  POCT glycosylated hemoglobin (Hb A1C)  Result Value Ref Range   Hemoglobin A1C 5.9

## 2015-03-02 ENCOUNTER — Encounter: Payer: Self-pay | Admitting: Family Medicine

## 2015-03-02 ENCOUNTER — Ambulatory Visit (INDEPENDENT_AMBULATORY_CARE_PROVIDER_SITE_OTHER): Payer: BLUE CROSS/BLUE SHIELD | Admitting: Family Medicine

## 2015-03-02 VITALS — BP 130/90 | HR 96 | Temp 99.2°F | Resp 16 | Ht 64.0 in | Wt 201.8 lb

## 2015-03-02 DIAGNOSIS — E8881 Metabolic syndrome: Secondary | ICD-10-CM | POA: Diagnosis not present

## 2015-03-02 DIAGNOSIS — I10 Essential (primary) hypertension: Secondary | ICD-10-CM | POA: Diagnosis not present

## 2015-03-02 DIAGNOSIS — D72829 Elevated white blood cell count, unspecified: Secondary | ICD-10-CM

## 2015-03-02 DIAGNOSIS — E669 Obesity, unspecified: Secondary | ICD-10-CM | POA: Diagnosis not present

## 2015-03-02 DIAGNOSIS — E7800 Pure hypercholesterolemia, unspecified: Secondary | ICD-10-CM

## 2015-03-02 DIAGNOSIS — E876 Hypokalemia: Secondary | ICD-10-CM | POA: Diagnosis not present

## 2015-03-02 DIAGNOSIS — Z23 Encounter for immunization: Secondary | ICD-10-CM

## 2015-03-02 DIAGNOSIS — Z79899 Other long term (current) drug therapy: Secondary | ICD-10-CM

## 2015-03-02 DIAGNOSIS — E66811 Obesity, class 1: Secondary | ICD-10-CM

## 2015-03-02 DIAGNOSIS — E559 Vitamin D deficiency, unspecified: Secondary | ICD-10-CM

## 2015-03-02 DIAGNOSIS — D751 Secondary polycythemia: Secondary | ICD-10-CM | POA: Diagnosis not present

## 2015-03-02 LAB — CBC WITH DIFFERENTIAL/PLATELET
Basophils Absolute: 0 10*3/uL (ref 0.0–0.1)
Basophils Relative: 0 % (ref 0–1)
EOS ABS: 0.3 10*3/uL (ref 0.0–0.7)
EOS PCT: 2 % (ref 0–5)
HCT: 46.1 % — ABNORMAL HIGH (ref 36.0–46.0)
Hemoglobin: 15.8 g/dL — ABNORMAL HIGH (ref 12.0–15.0)
LYMPHS ABS: 3 10*3/uL (ref 0.7–4.0)
Lymphocytes Relative: 23 % (ref 12–46)
MCH: 28.1 pg (ref 26.0–34.0)
MCHC: 34.3 g/dL (ref 30.0–36.0)
MCV: 82 fL (ref 78.0–100.0)
MONOS PCT: 6 % (ref 3–12)
MPV: 10.8 fL (ref 8.6–12.4)
Monocytes Absolute: 0.8 10*3/uL (ref 0.1–1.0)
Neutro Abs: 9.1 10*3/uL — ABNORMAL HIGH (ref 1.7–7.7)
Neutrophils Relative %: 69 % (ref 43–77)
PLATELETS: 346 10*3/uL (ref 150–400)
RBC: 5.62 MIL/uL — ABNORMAL HIGH (ref 3.87–5.11)
RDW: 14 % (ref 11.5–15.5)
WBC: 13.2 10*3/uL — ABNORMAL HIGH (ref 4.0–10.5)

## 2015-03-02 LAB — LIPID PANEL
CHOL/HDL RATIO: 7.5 ratio — AB (ref <=5.0)
Cholesterol: 239 mg/dL — ABNORMAL HIGH (ref 125–200)
HDL: 32 mg/dL — ABNORMAL LOW (ref >=46)
LDL Cholesterol: 163 mg/dL — ABNORMAL HIGH (ref <130)
Triglycerides: 221 mg/dL — ABNORMAL HIGH (ref <150)
VLDL: 44 mg/dL — ABNORMAL HIGH (ref <30)

## 2015-03-02 LAB — COMPREHENSIVE METABOLIC PANEL
ALT: 23 U/L (ref 6–29)
AST: 15 U/L (ref 10–35)
Albumin: 4.7 g/dL (ref 3.6–5.1)
Alkaline Phosphatase: 59 U/L (ref 33–130)
BILIRUBIN TOTAL: 0.6 mg/dL (ref 0.2–1.2)
BUN: 11 mg/dL (ref 7–25)
CO2: 26 mmol/L (ref 20–31)
CREATININE: 0.77 mg/dL (ref 0.50–1.05)
Calcium: 10 mg/dL (ref 8.6–10.4)
Chloride: 102 mmol/L (ref 98–110)
GLUCOSE: 116 mg/dL — AB (ref 65–99)
Potassium: 4 mmol/L (ref 3.5–5.3)
SODIUM: 141 mmol/L (ref 135–146)
Total Protein: 7.1 g/dL (ref 6.1–8.1)

## 2015-03-02 LAB — GLUCOSE, POCT (MANUAL RESULT ENTRY): POC Glucose: 99 mg/dl (ref 70–99)

## 2015-03-02 LAB — POCT GLYCOSYLATED HEMOGLOBIN (HGB A1C): Hemoglobin A1C: 5.9

## 2015-03-02 MED ORDER — LISINOPRIL-HYDROCHLOROTHIAZIDE 20-25 MG PO TABS: 1.0000 | ORAL_TABLET | Freq: Every day | ORAL | Status: DC

## 2015-03-02 NOTE — Patient Instructions (Addendum)
Fat and Cholesterol Control Diet Fat and cholesterol levels in your blood and organs are influenced by your diet. High levels of fat and cholesterol may lead to diseases of the heart, small and large blood vessels, gallbladder, liver, and pancreas. CONTROLLING FAT AND CHOLESTEROL WITH DIET Although exercise and lifestyle factors are important, your diet is key. That is because certain foods are known to raise cholesterol and others to lower it. The goal is to balance foods for their effect on cholesterol and more importantly, to replace saturated and trans fat with other types of fat, such as monounsaturated fat, polyunsaturated fat, and omega-3 fatty acids. On average, a person should consume no more than 15 to 17 g of saturated fat daily. Saturated and trans fats are considered "bad" fats, and they will raise LDL cholesterol. Saturated fats are primarily found in animal products such as meats, butter, and cream. However, that does not mean you need to give up all your favorite foods. Today, there are good tasting, low-fat, low-cholesterol substitutes for most of the things you like to eat. Choose low-fat or nonfat alternatives. Choose round or loin cuts of red meat. These types of cuts are lowest in fat and cholesterol. Chicken (without the skin), fish, veal, and ground turkey breast are great choices. Eliminate fatty meats, such as hot dogs and salami. Even shellfish have little or no saturated fat. Have a 3 oz (85 g) portion when you eat lean meat, poultry, or fish. Trans fats are also called "partially hydrogenated oils." They are oils that have been scientifically manipulated so that they are solid at room temperature resulting in a longer shelf life and improved taste and texture of foods in which they are added. Trans fats are found in stick margarine, some tub margarines, cookies, crackers, and baked goods.  When baking and cooking, oils are a great substitute for butter. The monounsaturated oils are  especially beneficial since it is believed they lower LDL and raise HDL. The oils you should avoid entirely are saturated tropical oils, such as coconut and palm.  Remember to eat a lot from food groups that are naturally free of saturated and trans fat, including fish, fruit, vegetables, beans, grains (barley, rice, couscous, bulgur wheat), and pasta (without cream sauces).  IDENTIFYING FOODS THAT LOWER FAT AND CHOLESTEROL  Soluble fiber may lower your cholesterol. This type of fiber is found in fruits such as apples, vegetables such as broccoli, potatoes, and carrots, legumes such as beans, peas, and lentils, and grains such as barley. Foods fortified with plant sterols (phytosterol) may also lower cholesterol. You should eat at least 2 g per day of these foods for a cholesterol lowering effect.  Read package labels to identify low-saturated fats, trans fat free, and low-fat foods at the supermarket. Select cheeses that have only 2 to 3 g saturated fat per ounce. Use a heart-healthy tub margarine that is free of trans fats or partially hydrogenated oil. When buying baked goods (cookies, crackers), avoid partially hydrogenated oils. Breads and muffins should be made from whole grains (whole-wheat or whole oat flour, instead of "flour" or "enriched flour"). Buy non-creamy canned soups with reduced salt and no added fats.  FOOD PREPARATION TECHNIQUES  Never deep-fry. If you must fry, either stir-fry, which uses very little fat, or use non-stick cooking sprays. When possible, broil, bake, or roast meats, and steam vegetables. Instead of putting butter or margarine on vegetables, use lemon and herbs, applesauce, and cinnamon (for squash and sweet potatoes). Use nonfat   yogurt, salsa, and low-fat dressings for salads.  LOW-SATURATED FAT / LOW-FAT FOOD SUBSTITUTES Meats / Saturated Fat (g)  Avoid: Steak, marbled (3 oz/85 g) / 11 g  Choose: Steak, lean (3 oz/85 g) / 4 g  Avoid: Hamburger (3 oz/85 g) / 7  g  Choose: Hamburger, lean (3 oz/85 g) / 5 g  Avoid: Ham (3 oz/85 g) / 6 g  Choose: Ham, lean cut (3 oz/85 g) / 2.4 g  Avoid: Chicken, with skin, dark meat (3 oz/85 g) / 4 g  Choose: Chicken, skin removed, dark meat (3 oz/85 g) / 2 g  Avoid: Chicken, with skin, light meat (3 oz/85 g) / 2.5 g  Choose: Chicken, skin removed, light meat (3 oz/85 g) / 1 g Dairy / Saturated Fat (g)  Avoid: Whole milk (1 cup) / 5 g  Choose: Low-fat milk, 2% (1 cup) / 3 g  Choose: Low-fat milk, 1% (1 cup) / 1.5 g  Choose: Skim milk (1 cup) / 0.3 g  Avoid: Hard cheese (1 oz/28 g) / 6 g  Choose: Skim milk cheese (1 oz/28 g) / 2 to 3 g  Avoid: Cottage cheese, 4% fat (1 cup) / 6.5 g  Choose: Low-fat cottage cheese, 1% fat (1 cup) / 1.5 g  Avoid: Ice cream (1 cup) / 9 g  Choose: Sherbet (1 cup) / 2.5 g  Choose: Nonfat frozen yogurt (1 cup) / 0.3 g  Choose: Frozen fruit bar / trace  Avoid: Whipped cream (1 tbs) / 3.5 g  Choose: Nondairy whipped topping (1 tbs) / 1 g Condiments / Saturated Fat (g)  Avoid: Mayonnaise (1 tbs) / 2 g  Choose: Low-fat mayonnaise (1 tbs) / 1 g  Avoid: Butter (1 tbs) / 7 g  Choose: Extra light margarine (1 tbs) / 1 g  Avoid: Coconut oil (1 tbs) / 11.8 g  Choose: Olive oil (1 tbs) / 1.8 g  Choose: Corn oil (1 tbs) / 1.7 g  Choose: Safflower oil (1 tbs) / 1.2 g  Choose: Sunflower oil (1 tbs) / 1.4 g  Choose: Soybean oil (1 tbs) / 2.4 g  Choose: Canola oil (1 tbs) / 1 g Document Released: 06/17/2005 Document Revised: 10/12/2012 Document Reviewed: 09/15/2013 [employer] Patient Information 2015 [place], [place]. This information is not intended to replace advice given to you by your health care provider. Make sure you discuss any questions you have with your health care provider.  Managing Your High Blood Pressure Blood pressure is a measurement of how forceful your blood is pressing against the walls of the arteries. Arteries are muscular tubes within the  circulatory system. Blood pressure does not stay the same. Blood pressure rises when you are active, excited, or nervous; and it lowers during sleep and relaxation. If the numbers measuring your blood pressure stay above normal most of the time, you are at risk for health problems. High blood pressure (hypertension) is a Gar[place]-term (chronic) condition in which blood pressure is elevated. A blood pressure reading is recorded as two numbers, such as 120 over 80 (or 120/80). The first, higher number is called the systolic pressure. It is a measure of the pressure in your arteries as the heart beats. The second, lower number is called the diastolic pressure. It is a measure of the pressure in your arteries as the heart relaxes between beats.  Keeping your blood pressure in a normal range is important to your overall health and prevention of health problems, such as heart  disease and stroke. When your blood pressure is uncontrolled, your heart has to work harder than normal. High blood pressure is a very common condition in adults because blood pressure tends to rise with age. Men and women are equally likely to have hypertension but at different times in life. Before age 15, men are more likely to have hypertension. After 51 years of age, women are more likely to have it. Hypertension is especially common in African Americans. This condition often has no signs or symptoms. The cause of the condition is usually not known. Your caregiver can help you come up with a plan to keep your blood pressure in a normal, healthy range. BLOOD PRESSURE STAGES Blood pressure is classified into four stages: normal, prehypertension, stage 1, and stage 2. Your blood pressure reading will be used to determine what type of treatment, if any, is necessary. Appropriate treatment options are tied to these four stages:  Normal  Systolic pressure (mm Hg): below 120.  Diastolic pressure (mm Hg): below 80. Prehypertension  Systolic  pressure (mm Hg): 120 to 139.  Diastolic pressure (mm Hg): 80 to 89. Stage1  Systolic pressure (mm Hg): 140 to 159.  Diastolic pressure (mm Hg): 90 to 99. Stage2  Systolic pressure (mm Hg): 160 or above.  Diastolic pressure (mm Hg): 100 or above. RISKS RELATED TO HIGH BLOOD PRESSURE Managing your blood pressure is an important responsibility. Uncontrolled high blood pressure can lead to:  A heart attack.  A stroke.  A weakened blood vessel (aneurysm).  Heart failure.  Kidney damage.  Eye damage.  Metabolic syndrome.  Memory and concentration problems. HOW TO MANAGE YOUR BLOOD PRESSURE Blood pressure can be managed effectively with lifestyle changes and medicines (if needed). Your caregiver will help you come up with a plan to bring your blood pressure within a normal range. Your plan should include the following: Education  Read all information provided by your caregivers about how to control blood pressure.  Educate yourself on the latest guidelines and treatment recommendations. New research is always being done to further define the risks and treatments for high blood pressure. Lifestylechanges  Control your weight.  Avoid smoking.  Stay physically active.  Reduce the amount of salt in your diet.  Reduce stress.  Control any chronic conditions, such as high cholesterol or diabetes.  Reduce your alcohol intake. Medicines  Several medicines (antihypertensive medicines) are available, if needed, to bring blood pressure within a normal range. Communication  Review all the medicines you take with your caregiver because there may be side effects or interactions.  Talk with your caregiver about your diet, exercise habits, and other lifestyle factors that may be contributing to high blood pressure.  See your caregiver regularly. Your caregiver can help you create and adjust your plan for managing high blood pressure. RECOMMENDATIONS FOR TREATMENT AND  FOLLOW-UP  The following recommendations are based on current guidelines for managing high blood pressure in nonpregnant adults. Use these recommendations to identify the proper follow-up period or treatment option based on your blood pressure reading. You can discuss these options with your caregiver.  Systolic pressure of 809 to 983 or diastolic pressure of 80 to 89: Follow up with your caregiver as directed.  Systolic pressure of 382 to 505 or diastolic pressure of 90 to 100: Follow up with your caregiver within 2 months.  Systolic pressure above 397 or diastolic pressure above 673: Follow up with your caregiver within 1 month.  Systolic pressure above 419 or diastolic  pressure above 110: Consider antihypertensive therapy; follow up with your caregiver within 1 week.  Systolic pressure above 992 or diastolic pressure above 426: Begin antihypertensive therapy; follow up with your caregiver within 1 week. Document Released: 03/11/2012 Document Reviewed: 03/11/2012 [facility] Patient Information 2015 [place]. This information is not intended to replace advice given to you by your health care provider. Make sure you discuss any questions you have with your health care provider.  Potassium Content of Foods Potassium is a mineral found in many foods and drinks. It helps keep fluids and minerals balanced in your body and affects how steadily your heart beats. Potassium also helps control your blood pressure and keep your muscles and nervous system healthy. Certain health conditions and medicines may change the balance of potassium in your body. When this happens, you can help balance your level of potassium through the foods that you do or do not eat. Your health care provider or dietitian may recommend an amount of potassium that you should have each day. The following lists of foods provide the amount of potassium (in parentheses) per serving in each item. HIGH IN POTASSIUM  The following  foods and beverages have 200 mg or more of potassium per serving:  Apricots, 2 raw or 5 dry (200 mg).  Artichoke, 1 medium (345 mg).  Avocado, raw,  each (245 mg).  Banana, 1 medium (425 mg).  Beans, lima, or baked beans, canned,  cup (280 mg).  Beans, white, canned,  cup (595 mg).  Beef roast, 3 oz (320 mg).  Beef, ground, 3 oz (270 mg).  Beets, raw or cooked,  cup (260 mg).  Bran muffin, 2 oz (300 mg).  Broccoli,  cup (230 mg).  Brussels sprouts,  cup (250 mg).  Cantaloupe,  cup (215 mg).  Cereal, 100% bran,  cup (200-400 mg).  Cheeseburger, single, fast food, 1 each (225-400 mg).  Chicken, 3 oz (220 mg).  Clams, canned, 3 oz (535 mg).  Crab, 3 oz (225 mg).  Dates, 5 each (270 mg).  Dried beans and peas,  cup (300-475 mg).  Figs, dried, 2 each (260 mg).  Fish: halibut, tuna, cod, snapper, 3 oz (480 mg).  Fish: salmon, haddock, swordfish, perch, 3 oz (300 mg).  Fish, tuna, canned 3 oz (200 mg).  [place] fries, fast food, 3 oz (470 mg).  Granola with fruit and nuts,  cup (200 mg).  Grapefruit juice,  cup (200 mg).  Greens, beet,  cup (655 mg).  Honeydew melon,  cup (200 mg).  Kale, raw, 1 cup (300 mg).  Kiwi, 1 medium (240 mg).  Kohlrabi, rutabaga, parsnips,  cup (280 mg).  Lentils,  cup (365 mg).  Mango, 1 each (325 mg).  Milk, chocolate, 1 cup (420 mg).  Milk: nonfat, low-fat, whole, buttermilk, 1 cup (350-380 mg).  Molasses, 1 Tbsp (295 mg).  Mushrooms,  cup (280) mg.  Nectarine, 1 each (275 mg).  Nuts: almonds, peanuts, hazelnuts, [place], cashew, mixed, 1 oz (200 mg).  Nuts, pistachios, 1 oz (295 mg).  Orange, 1 each (240 mg).  Orange juice,  cup (235 mg).  Papaya, medium,  fruit (390 mg).  Peanut butter, chunky, 2 Tbsp (240 mg).  Peanut butter, smooth, 2 Tbsp (210 mg).  Pear, 1 medium (200 mg).  Pomegranate, 1 whole (400 mg).  Pomegranate juice,  cup (215 mg).  Pork, 3 oz (350 mg).  Potato  chips, salted, 1 oz (465 mg).  Potato, baked with skin, 1 medium (925 mg).  Potatoes, boiled,  cup (255 mg).  Potatoes, mashed,  cup (330 mg).  Prune juice,  cup (370 mg).  Prunes, 5 each (305 mg).  Pudding, chocolate,  cup (230 mg).  Pumpkin, canned,  cup (250 mg).  Raisins, seedless,  cup (270 mg).  Seeds, sunflower or pumpkin, 1 oz (240 mg).  Soy milk, 1 cup (300 mg).  Spinach,  cup (420 mg).  Spinach, canned,  cup (370 mg).  Sweet potato, baked with skin, 1 medium (450 mg).  Swiss chard,  cup (480 mg).  Tomato or vegetable juice,  cup (275 mg).  Tomato sauce or puree,  cup (400-550 mg).  Tomato, raw, 1 medium (290 mg).  Tomatoes, canned,  cup (200-300 mg).  [place], 3 oz (250 mg).  Wheat germ, 1 oz (250 mg).  Winter squash,  cup (250 mg).  Yogurt, plain or fruited, 6 oz (260-435 mg).  Zucchini,  cup (220 mg). MODERATE IN POTASSIUM The following foods and beverages have 50-200 mg of potassium per serving:  Apple, 1 each (150 mg).  Apple juice,  cup (150 mg).  Applesauce,  cup (90 mg).  Apricot nectar,  cup (140 mg).  Asparagus, small spears,  cup or 6 spears (155 mg).  Bagel, cinnamon raisin, 1 each (130 mg).  Bagel, egg or plain, 4 in., 1 each (70 mg).  Beans, green,  cup (90 mg).  Beans, yellow,  cup (190 mg).  Beer, regular, 12 oz (100 mg).  Beets, canned,  cup (125 mg).  Blackberries,  cup (115 mg).  Blueberries,  cup (60 mg).  Bread, whole wheat, 1 slice (70 mg).  Broccoli, raw,  cup (145 mg).  Cabbage,  cup (150 mg).  Carrots, cooked or raw,  cup (180 mg).  Cauliflower, raw,  cup (150 mg).  Celery, raw,  cup (155 mg).  Cereal, bran flakes, cup (120-150 mg).  Cheese, cottage,  cup (110 mg).  Cherries, 10 each (150 mg).  Chocolate, 1 oz bar (165 mg).  Coffee, brewed 6 oz (90 mg).  Corn,  cup or 1 ear (195 mg).  Cucumbers,  cup (80 mg).  Egg, large, 1 each (60 mg).  Eggplant,   cup (60 mg).  Endive, raw, cup (80 mg).  English muffin, 1 each (65 mg).  Fish, orange roughy, 3 oz (150 mg).  Frankfurter, beef or pork, 1 each (75 mg).  Fruit cocktail,  cup (115 mg).  Grape juice,  cup (170 mg).  Grapefruit,  fruit (175 mg).  Grapes,  cup (155 mg).  Greens: kale, turnip, collard,  cup (110-150 mg).  Ice cream or frozen yogurt, chocolate,  cup (175 mg).  Ice cream or frozen yogurt, vanilla,  cup (120-150 mg).  Lemons, limes, 1 each (80 mg).  Lettuce, all types, 1 cup (100 mg).  Mixed vegetables,  cup (150 mg).  Mushrooms, raw,  cup (110 mg).  Nuts: walnuts, pecans, or macadamia, 1 oz (125 mg).  Oatmeal,  cup (80 mg).  Okra,  cup (110 mg).  Onions, raw,  cup (120 mg).  Peach, 1 each (185 mg).  Peaches, canned,  cup (120 mg).  Pears, canned,  cup (120 mg).  Peas, green, frozen,  cup (90 mg).  Peppers, green,  cup (130 mg).  Peppers, red,  cup (160 mg).  Pineapple juice,  cup (165 mg).  Pineapple, fresh or canned,  cup (100 mg).  Plums, 1 each (105 mg).  Pudding, vanilla,  cup (150 mg).  Raspberries,  cup (90 mg).  Rhubarb,  cup (115 mg).  Rice, wild,  cup (80 mg).  Shrimp, 3 oz (155 mg).  Spinach, raw, 1 cup (170 mg).  Strawberries,  cup (125 mg).  Summer squash  cup (175-200 mg).  Swiss chard, raw, 1 cup (135 mg).  Tangerines, 1 each (140 mg).  Tea, brewed, 6 oz (65 mg).  Turnips,  cup (140 mg).  Watermelon,  cup (85 mg).  Wine, red, table, 5 oz (180 mg).  Wine, white, table, 5 oz (100 mg). LOW IN POTASSIUM The following foods and beverages have less than 50 mg of potassium per serving.  Bread, white, 1 slice (30 mg).  Carbonated beverages, 12 oz (less than 5 mg).  Cheese, 1 oz (20-30 mg).  Cranberries,  cup (45 mg).  Cranberry juice cocktail,  cup (20 mg).  Fats and oils, 1 Tbsp (less than 5 mg).  Hummus, 1 Tbsp (32 mg).  Nectar: papaya, mango, or pear,  cup (35  mg).  Rice, white or brown,  cup (50 mg).  Spaghetti or macaroni,  cup cooked (30 mg).  Tortilla, flour or corn, 1 each (50 mg).  Waffle, 4 in., 1 each (50 mg).  Water chestnuts,  cup (40 mg). Document Released: 01/29/2005 Document Revised: 06/22/2013 Document Reviewed: 05/14/2013 [facility] Patient Information 2015 [place], [place]. This information is not intended to replace advice given to you by your health care provider. Make sure you discuss any questions you have with your health care provider.

## 2015-03-03 ENCOUNTER — Ambulatory Visit: Payer: BLUE CROSS/BLUE SHIELD | Admitting: Family Medicine

## 2015-03-03 LAB — PATHOLOGIST SMEAR REVIEW

## 2015-03-11 ENCOUNTER — Other Ambulatory Visit: Payer: Self-pay | Admitting: Family Medicine

## 2015-04-07 ENCOUNTER — Other Ambulatory Visit: Payer: Self-pay | Admitting: Family Medicine

## 2015-04-10 ENCOUNTER — Other Ambulatory Visit: Payer: Self-pay | Admitting: Family Medicine

## 2015-04-13 ENCOUNTER — Encounter: Payer: Self-pay | Admitting: Gastroenterology

## 2015-04-25 ENCOUNTER — Ambulatory Visit (AMBULATORY_SURGERY_CENTER): Payer: Self-pay

## 2015-04-25 VITALS — Ht 64.0 in | Wt 205.2 lb

## 2015-04-25 DIAGNOSIS — Z1211 Encounter for screening for malignant neoplasm of colon: Secondary | ICD-10-CM

## 2015-04-25 MED ORDER — SUPREP BOWEL PREP KIT 17.5-3.13-1.6 GM/177ML PO SOLN: 1.0000 | Freq: Once | ORAL | Status: DC

## 2015-04-25 NOTE — Progress Notes (Signed)
No allergies to eggs or soy No past problems with anesthesia No diet/weight loss meds No home oxygen  Has email  Emmi instructions given for colonoscopy 

## 2015-05-09 ENCOUNTER — Encounter: Payer: Self-pay | Admitting: Gastroenterology

## 2015-05-09 ENCOUNTER — Ambulatory Visit (AMBULATORY_SURGERY_CENTER): Payer: BLUE CROSS/BLUE SHIELD | Admitting: Gastroenterology

## 2015-05-09 VITALS — BP 145/94 | HR 87 | Temp 98.1°F | Resp 13 | Ht 64.0 in | Wt 205.0 lb

## 2015-05-09 DIAGNOSIS — D124 Benign neoplasm of descending colon: Secondary | ICD-10-CM | POA: Diagnosis not present

## 2015-05-09 DIAGNOSIS — K635 Polyp of colon: Secondary | ICD-10-CM | POA: Diagnosis not present

## 2015-05-09 DIAGNOSIS — Z1211 Encounter for screening for malignant neoplasm of colon: Secondary | ICD-10-CM

## 2015-05-09 DIAGNOSIS — D125 Benign neoplasm of sigmoid colon: Secondary | ICD-10-CM

## 2015-05-09 MED ORDER — SODIUM CHLORIDE 0.9 % IV SOLN: 500.0000 mL | INTRAVENOUS | Status: DC

## 2015-05-09 NOTE — Progress Notes (Signed)
Called to room to assist during endoscopic procedure.  Patient ID and intended procedure confirmed with present staff. Received instructions for my participation in the procedure from the performing physician.  

## 2015-05-09 NOTE — Patient Instructions (Signed)
YOU HAD AN ENDOSCOPIC PROCEDURE TODAY AT THE [facility]:   Refer to the procedure report that was given to you for any specific questions about what was found during the examination.  If the procedure report does not answer your questions, please call your gastroenterologist to clarify.  If you requested that your care partner not be given the details of your procedure findings, then the procedure report has been included in a sealed envelope for you to review at your convenience later.  YOU SHOULD EXPECT: Some feelings of bloating in the abdomen. Passage of more gas than usual.  Walking can help get rid of the air that was put into your GI tract during the procedure and reduce the bloating. If you had a lower endoscopy (such as a colonoscopy or flexible sigmoidoscopy) you may notice spotting of blood in your stool or on the toilet paper. If you underwent a bowel prep for your procedure, you may not have a normal bowel movement for a few days.  Please Note:  You might notice some irritation and congestion in your nose or some drainage.  This is from the oxygen used during your procedure.  There is no need for concern and it should clear up in a day or so.  SYMPTOMS TO REPORT IMMEDIATELY:   Following lower endoscopy (colonoscopy or flexible sigmoidoscopy):  Excessive amounts of blood in the stool  Significant tenderness or worsening of abdominal pains  Swelling of the abdomen that is new, acute  Fever of 100F or higher   For urgent or emergent issues, a gastroenterologist can be reached at any hour by calling [phone].   DIET: Your first meal following the procedure should be a small meal and then it is ok to progress to your normal diet. Heavy or fried foods are harder to digest and may make you feel nauseous or bloated.  Likewise, meals heavy in dairy and vegetables can increase bloating.  Drink plenty of fluids but you should avoid alcoholic beverages for 24  hours.  ACTIVITY:  You should plan to take it easy for the rest of today and you should NOT DRIVE or use heavy machinery until tomorrow (because of the sedation medicines used during the test).    FOLLOW UP: Our staff will call the number listed on your records the next business day following your procedure to check on you and address any questions or concerns that you may have regarding the information given to you following your procedure. If we do not reach you, we will leave a message.  However, if you are feeling well and you are not experiencing any problems, there is no need to return our call.  We will assume that you have returned to your regular daily activities without incident.  If any biopsies were taken you will be contacted by phone or by letter within the next 1-3 weeks.  Please call us at [phone] if you have not heard about the biopsies in 3 weeks.    SIGNATURES/CONFIDENTIALITY: You and/or your care partner have signed paperwork which will be entered into your electronic medical record.  These signatures attest to the fact that that the information above on your After Visit Summary has been reviewed and is understood.  Full responsibility of the confidentiality of this discharge information lies with you and/or your care-partner.    Resume medications. Information given on polyps. 

## 2015-05-09 NOTE — Op Note (Signed)
[facility]  [employer] [place], [id number]   COLONOSCOPY PROCEDURE REPORT  PATIENT: Ariana Juarez, Ariana Juarez  MR#: [id number] BIRTHDATE: 1966/06/13 , 49  yrs. old GENDER: female ENDOSCOPIST: Milus Banister, MD REFERRED NL:[id number] Dellis [place], [place] PROCEDURE DATE:  05/09/2015 PROCEDURE:   Colonoscopy, screening and Colonoscopy with snare polypectomy First Screening Colonoscopy - Avg.  risk and is 50 yrs.  old or older Yes.  Prior Negative Screening - Now for repeat screening. N/A  History of Adenoma - Now for follow-up colonoscopy & has been > or = to 3 yrs.  N/A  Polyps removed today? Yes ASA CLASS:   Class II INDICATIONS:Screening for colonic neoplasia and Colorectal Neoplasm Risk Assessment for this procedure is average risk. MEDICATIONS: Monitored anesthesia care and Propofol 300 mg IV  DESCRIPTION OF PROCEDURE:   After the risks benefits and alternatives of the procedure were thoroughly explained, informed consent was obtained.  The digital rectal exam revealed no abnormalities of the rectum.   The [id number]  endoscope was introduced through the anus and advanced to the cecum, which was identified by both the appendix and ileocecal valve. No adverse events experienced.   The quality of the prep was excellent.  The instrument was then slowly withdrawn as the colon was fully examined. Estimated blood loss is zero unless otherwise noted in this procedure report.  COLON FINDINGS: Two sessile polyps ranging between 3-47mm in size were found in the sigmoid colon and descending colon. Polypectomies were performed with a cold snare.  The resection was complete, the polyp tissue was completely retrieved and sent to histology.   A semi-pedunculated polyp measuring 12 mm in size was found in the sigmoid colon.  A polypectomy was performed using snare cautery.  The resection was complete, the polyp tissue was completely retrieved and sent to histology.   The examination  was otherwise normal.  Retroflexed views revealed no abnormalities. The time to cecum = 2.9 Withdrawal time = 12.5   The scope was withdrawn and the procedure completed. COMPLICATIONS: There were no immediate complications.  ENDOSCOPIC IMPRESSION: 1.   Two sessile polyps ranging between 3-38mm in size were found in the sigmoid colon and descending colon; polypectomies were performed with a cold snare 2.   Semi-pedunculated polyp was found in the sigmoid colon; polypectomy was performed using snare cautery 3.   The examination was otherwise normal  RECOMMENDATIONS: If the polyp(s) removed today are proven to be adenomatous (pre-cancerous) polyps, you will need a colonoscopy in 3-5 years. Otherwise you should continue to follow colorectal cancer screening guidelines for "routine risk" patients with a colonoscopy in 10 years.  You will receive a letter within 1-2 weeks with the results of your biopsy as well as final recommendations.  Please call my office if you have not received a letter after 3 weeks.  eSigned:  Milus Banister, MD 05/09/2015 7:59 AM

## 2015-05-09 NOTE — Progress Notes (Signed)
A/ox3 pleased with MAC, report to Sheila RN 

## 2015-05-10 ENCOUNTER — Telehealth: Payer: Self-pay

## 2015-05-10 NOTE — Telephone Encounter (Signed)
  Follow up Call-  Call back number 05/09/2015  Post procedure Call Back phone  # [phone]  Permission to leave phone message Yes     Patient questions:  Do you have a fever, pain , or abdominal swelling? No. Pain Score  0 *  Have you tolerated food without any problems? Yes.    Have you been able to return to your normal activities? Yes.    Do you have any questions about your discharge instructions: Diet   No. Medications  Yes.   Follow up visit  No.  Do you have questions or concerns about your Care? Yes.    Actions: * If pain score is 4 or above: No action needed, pain <4.   Pt reported this am during phone follow up call.  She reported that after drinking her suprep the night before her procedure, she went to bed and woke up with both her legs itching from the knee to her ankles.  She denied redness or swelling.  She drank the am dose of suprep ans no reactions the day of her procedure.  She said this am 04-09-15 she woke up and the top of her right hand was warm to touch, red and itching.  She said the reactions was gone when I made the follow up call.  She said no other symptoms at this time.  I advised her to call us back if any other sx.  I told her I would make a note in the follow up call section of her chart.  For her to make a mental note, to look back in her chart for possible reaction to [place].  Pt agree with what I advised.  maw

## 2015-05-17 ENCOUNTER — Encounter: Payer: Self-pay | Admitting: Gastroenterology

## 2015-05-22 ENCOUNTER — Encounter: Payer: Self-pay | Admitting: Family Medicine

## 2015-06-29 ENCOUNTER — Ambulatory Visit (INDEPENDENT_AMBULATORY_CARE_PROVIDER_SITE_OTHER): Payer: BLUE CROSS/BLUE SHIELD | Admitting: Family Medicine

## 2015-06-29 ENCOUNTER — Encounter: Payer: Self-pay | Admitting: Family Medicine

## 2015-06-29 VITALS — BP 152/75 | HR 92 | Temp 99.4°F | Resp 16 | Ht 64.0 in | Wt 203.0 lb

## 2015-06-29 DIAGNOSIS — I10 Essential (primary) hypertension: Secondary | ICD-10-CM | POA: Diagnosis not present

## 2015-06-29 DIAGNOSIS — E8881 Metabolic syndrome: Secondary | ICD-10-CM

## 2015-06-29 DIAGNOSIS — E7800 Pure hypercholesterolemia, unspecified: Secondary | ICD-10-CM | POA: Diagnosis not present

## 2015-06-29 MED ORDER — LISINOPRIL 40 MG PO TABS
40.0000 mg | ORAL_TABLET | Freq: Every day | ORAL | Status: DC
Start: 2015-06-29 — End: 2015-12-23

## 2015-06-29 MED ORDER — TRIAMTERENE-HCTZ 37.5-25 MG PO TABS: 1.0000 | ORAL_TABLET | Freq: Every day | ORAL | Status: DC

## 2015-06-29 NOTE — Progress Notes (Signed)
Subjective:    Patient ID: Ariana Juarez, female    DOB: 30-May-1966, 49 y.o.   MRN: [id number]  Chief Complaint  Patient presents with  . Follow-up  . Hypertension    HPI  Pt has been checking her blood pressure at her office and it has been ranging about 139/44 - 162/94.  Has cut her sodium in the diet and has started drinking beet juice to help with BP.  She has not been seen at [facility] since our last visit but she is planning to go back there after her bills are paid.  Is taking herbal products for both bp support and chol support that she [facility] at [facility].  Spotting vaginally - every few months so still on Depo-Provera from gyn Dr. Julien Girt for her endometriosis. No perimenopausal sxs she has identified yet.  Had her colonoscopy on 11/18 with Dr. Kara Dies Dr. Julien Nordmann - hematology - due to persistent leukocytosis and polycythemia. Appeared to be reactive from endometriosis and Depo-Provera so watchful waiting.  Taking Vit D.  Past Medical History  Diagnosis Date  . Hypertension   . Hyperlipidemia   . Depression   . Dysmenorrhea   . Anemia   . Glaucoma   . Shortness of breath     on exertion, ie climbing stairs  . GERD (gastroesophageal reflux disease)   . Endometriosis   . 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 Cholecalciferol (VITAMIN D-3 PO) Take by mouth.    . Coenzyme Q10 (COQ10) 100 MG CAPS Take 1 capsule by mouth daily.    . Flaxseed, Linseed, (FLAX SEEDS PO) Take 1 tablet by mouth daily.    . folic acid (FOLVITE) 1 MG tablet Take 1 mg by mouth daily.    Marland Kitchen lisinopril-hydrochlorothiazide (PRINZIDE,ZESTORETIC) 20-25 MG per tablet Take 1 tablet by mouth daily. 90 tablet 3  . medroxyPROGESTERone (DEPO-PROVERA) 150 MG/ML injection     . Menaquinone-7 (VITAMIN K2 PO) Take by mouth.    . Nutritional  Supplements (DHEA PO) Take by mouth.    Marland Kitchen OVER THE COUNTER MEDICATION Pregnenolone 75 mg taking daily    . OVER THE COUNTER MEDICATION Tumeric  500 mg taking daily    . OVER THE COUNTER MEDICATION Methylation Complete taking daily    . Probiotic Product (ADVANCED PROBIOTIC 10 PO) Take by mouth.    . sertraline (ZOLOFT) 50 MG tablet Take 1.5 tablets (75 mg total) by mouth daily. 135 tablet 3  . Travoprost, BAK Free, (TRAVATAN) 0.004 % SOLN ophthalmic solution Place 1 drop into the left eye at bedtime.     No current facility-administered medications on file prior to visit.   Allergies  Allergen Reactions  . Mobic [Meloxicam]     Patient states she "felt her heart beat" while on med     Review of Systems  Constitutional: Positive for fatigue. Negative for fever, chills, diaphoresis and appetite change.  Eyes: Negative for visual disturbance.  Respiratory: Negative for cough and shortness of breath.   Cardiovascular: Negative for chest pain, palpitations and leg swelling.  Genitourinary: Negative for decreased urine volume.  Neurological: Negative for syncope and headaches.  Hematological: Does not bruise/bleed easily.       Objective:  BP 152/75 mmHg  Pulse 92  Temp(Src) 99.4 F (37.4 C)  Resp 16  Ht  5\' 4"  (1.626 m)  Wt 203 lb (92.08 kg)  BMI 34.83 kg/m2  Physical Exam  Constitutional: She is oriented to person, place, and time. She appears well-developed and well-nourished. No distress.  HENT:  Head: Normocephalic and atraumatic.  Right Ear: External ear normal.  Left Ear: External ear normal.  Eyes: Conjunctivae are normal. No scleral icterus.  Neck: Normal range of motion. Neck supple. No thyromegaly present.  Cardiovascular: Normal rate, regular rhythm, normal heart sounds and intact distal pulses.   Pulmonary/Chest: Effort normal and breath sounds normal. No respiratory distress.  Musculoskeletal: She exhibits no edema.  Lymphadenopathy:    She has no cervical  adenopathy.  Neurological: She is alert and oriented to person, place, and time.  Skin: Skin is warm and dry. She is not diaphoretic. No erythema.  Psychiatric: She has a normal mood and affect. Her behavior is normal.       Assessment & Plan:   1. Essential hypertension   if she needs labs from [facility] advised to bring rx for me to order so can be run through her ins. Poor control on lisinopril-hctz 20-25 so will change to lisinopril 40 and maxzide.  2. HPL - working on tlc  3. Metabolic syndrome with glucose intolerance  4. Mood d/o - ok to fill zoloft x 1 yr whenever requested  Meds ordered this encounter  Medications  . lisinopril (PRINIVIL,ZESTRIL) 40 MG tablet    Sig: Take 1 tablet (40 mg total) by mouth daily.    Dispense:  90 tablet    Refill:  1  . triamterene-hydrochlorothiazide (MAXZIDE-25) 37.5-25 MG tablet    Sig: Take 1 tablet by mouth daily.    Dispense:  90 tablet    Refill:  1    Delman Cheadle, MD MPH

## 2015-08-17 ENCOUNTER — Encounter: Payer: Self-pay | Admitting: Family Medicine

## 2015-08-17 ENCOUNTER — Ambulatory Visit (INDEPENDENT_AMBULATORY_CARE_PROVIDER_SITE_OTHER): Payer: BLUE CROSS/BLUE SHIELD | Admitting: Family Medicine

## 2015-08-17 VITALS — BP 120/74 | HR 108 | Temp 99.2°F | Resp 16 | Ht 64.0 in | Wt 199.2 lb

## 2015-08-17 DIAGNOSIS — Z124 Encounter for screening for malignant neoplasm of cervix: Secondary | ICD-10-CM | POA: Diagnosis not present

## 2015-08-17 DIAGNOSIS — B3749 Other urogenital candidiasis: Secondary | ICD-10-CM

## 2015-08-17 DIAGNOSIS — N938 Other specified abnormal uterine and vaginal bleeding: Secondary | ICD-10-CM | POA: Diagnosis not present

## 2015-08-17 DIAGNOSIS — Z113 Encounter for screening for infections with a predominantly sexual mode of transmission: Secondary | ICD-10-CM

## 2015-08-17 DIAGNOSIS — B3789 Other sites of candidiasis: Secondary | ICD-10-CM | POA: Diagnosis not present

## 2015-08-17 DIAGNOSIS — N949 Unspecified condition associated with female genital organs and menstrual cycle: Secondary | ICD-10-CM | POA: Diagnosis not present

## 2015-08-17 DIAGNOSIS — N852 Hypertrophy of uterus: Secondary | ICD-10-CM | POA: Diagnosis not present

## 2015-08-17 DIAGNOSIS — N72 Inflammatory disease of cervix uteri: Secondary | ICD-10-CM

## 2015-08-17 NOTE — Progress Notes (Signed)
Subjective:    Patient ID: Ariana Juarez, female    DOB: 02/23/1966, 50 y.o.   MRN: [id number] Chief Complaint  Patient presents with  . Gynecologic Exam    HPI  Last pap was normal/negative 09/2011 so had repeat done last March which was also norm/neg but now she is having more menstrual abnormalities and pain so would like to have her pap smear repeated -  Has already checked with insurance and ok to repeat pap even though prior was only 11 mos.  Her appt with gyn isn't untl April when her job is over so she is not sure when her ins is going to end but may be about then so as she is having some new concerning sxs she did not want to wait until her routine appt and then not be able to afford any necessary work-up.  Has been there since 1988  Having pink spotting, never full red since with last cycle.  Last visit with Dr. Julien Girt was last April.  Has been having Depo-Provera for years - her last injection was sometime in January.  No hot flashes or breast tenderness.  Is having adnexal pain altenrating sides while ovulating, menstrual cramping as well.  Was spotting for several week in Dec and sev weeks in [place] = will get more when she goes tot he bathroom, has to wear at all times. No bloating, no urinary sxs, normal appetite. No pelvic pain with activity trying to start exercising again.  Bowels are ok Had colonoscopy with 3 polyps and has to have repeated in 3 years.   Itching on top medal aspect of left foot using antifungal for sev d with only partial improvement, rash under breast for sev d responds somewhat to nystatin cream but never fully goes away.  Past Medical History  Diagnosis Date  . Hypertension   . Hyperlipidemia   . Depression   . Dysmenorrhea   . Anemia   . Glaucoma   . Shortness of breath     on exertion, ie climbing stairs  . GERD (gastroesophageal reflux disease)   . Endometriosis   . 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 Dilation and curettage of uterus    . Dental surgery      bone grafting  . Laparoscopy N/A 11/12/2012    Procedure: LAPAROSCOPY DIAGNOSTIC;  Surgeon: Marylynn Pearson, MD;  Location: [facility];  Service: Gynecology;  Laterality: N/A;   Current Outpatient Prescriptions on File Prior to Visit  Medication Sig Dispense Refill  . Cholecalciferol (VITAMIN D-3 PO) Take by mouth.    . Coenzyme Q10 (COQ10) 100 MG CAPS Take 1 capsule by mouth daily.    Marland Kitchen lisinopril (PRINIVIL,ZESTRIL) 40 MG tablet Take 1 tablet (40 mg total) by mouth daily. 90 tablet 1  . medroxyPROGESTERone (DEPO-PROVERA) 150 MG/ML injection     . Menaquinone-7 (VITAMIN K2 PO) Take by mouth.    . Nutritional Supplements (DHEA PO) Take by mouth.    Marland Kitchen OVER THE COUNTER MEDICATION Pregnenolone 75 mg taking daily    . OVER THE COUNTER MEDICATION Tumeric  500 mg taking daily    . OVER THE COUNTER MEDICATION Methylation Complete taking daily    . Probiotic Product (ADVANCED PROBIOTIC 10 PO) Take by mouth.    . sertraline (ZOLOFT) 50 MG tablet Take 1.5 tablets (75 mg total) by mouth daily.  135 tablet 3  . Travoprost, BAK Free, (TRAVATAN) 0.004 % SOLN ophthalmic solution Place 1 drop into the left eye at bedtime.    . triamterene-hydrochlorothiazide (MAXZIDE-25) 37.5-25 MG tablet Take 1 tablet by mouth daily. 90 tablet 1  . Flaxseed, Linseed, (FLAX SEEDS PO) Take 1 tablet by mouth daily. Reported on [id number]    . folic acid (FOLVITE) 1 MG tablet Take 1 mg by mouth daily. Reported on 08/17/2015     No current facility-administered medications on file prior to visit.   Allergies  Allergen Reactions  . Mobic [Meloxicam]     Patient states she "felt her heart beat" while on med   Family History  Problem Relation Age of Onset  . Cancer Mother 80    uterine  . Cancer Father     lung/liver  . Spina bifida Brother     mild  . Crohn's disease Brother 87  . Colon cancer  Neg Hx    Social History   Social History  . Marital Status: Divorced    Spouse Name: n/a  . Number of Children: 0  . Years of Education: 12   Occupational History  . training assistant [employer]   Social History Main Topics  . Smoking status: Never Smoker   . Smokeless tobacco: Never Used  . Alcohol Use: Yes     Comment: one drink every 2 years  . Drug Use: No  . Sexual Activity:    Partners: Male   Other Topics Concern  . None   Social History Narrative   Lives with her mother and brother, and her maternal uncle.    Review of Systems  Constitutional: Positive for fatigue. Negative for fever, chills, diaphoresis, activity change, appetite change and unexpected weight change.  Gastrointestinal: Positive for abdominal pain. Negative for vomiting, diarrhea, constipation and abdominal distention.  Genitourinary: Positive for vaginal bleeding, vaginal pain, menstrual problem, pelvic pain and dyspareunia. Negative for dysuria, urgency, frequency, hematuria, flank pain, decreased urine volume, vaginal discharge, difficulty urinating and genital sores.  Skin: Positive for color change and rash.  Allergic/Immunologic: Negative for immunocompromised state.       Objective:  BP 120/74 mmHg  Pulse 108  Temp(Src) 99.2 F (37.3 C) (Oral)  Resp 16  Ht 5\' 4"  (1.626 m)  Wt 199 lb 3.2 oz (90.357 kg)  BMI 34.18 kg/m2  SpO2 98%  LMP 08/10/2015  Physical Exam  Constitutional: She is oriented to person, place, and time. She appears well-developed and well-nourished. No distress.  HENT:  Head: Normocephalic and atraumatic.  Cardiovascular: Normal rate, regular rhythm, normal heart sounds and intact distal pulses.   Pulmonary/Chest: Effort normal and breath sounds normal.  Abdominal: Soft. Bowel sounds are normal. She exhibits no distension. There is no tenderness. There is no rebound and no guarding.  Genitourinary: Pelvic exam was performed with patient supine. There is no rash,  tenderness or lesion on the right labia. There is no rash, tenderness or lesion on the left labia. Uterus is enlarged and tender. Cervix exhibits friability. Cervix exhibits no motion tenderness. Right adnexum displays no mass, no tenderness and no fullness. Left adnexum displays no mass, no tenderness and no fullness. No erythema or tenderness in the vagina.  Lymphadenopathy:       Right: No inguinal adenopathy present.       Left: No inguinal adenopathy present.  Neurological: She is alert and oriented to person, place, and time.  Skin: Skin is warm and dry. She is not diaphoretic.  Psychiatric: She has a normal mood and affect. Her behavior is normal.      Assessment & Plan:   1. Screening for cervical cancer   2. Routine screening for STI (sexually transmitted infection)   3. Candidiasis of breast - try nystatin powder  4. Candidiasis of perineum - try nystatin cream  5. Uterine enlargement   6. Uterine tenderness   7. Cervical inflammation - agree that 50 yo pt needs to be seen asap for change with dysfunctional vaginal bleeding along with uterine enlargement and tenderness on exam.  Will see if pt can get in with her gynecologist Dr. Julien Girt asap and will need [place] as well before she looses her health insurance in April  8.    Foot rash - try mycolog   Results for orders placed or performed in visit on 08/17/15  Pap IG and HPV (high risk) DNA detection  Result Value Ref Range   HPV DNA High Risk Not Detected    Specimen adequacy: SEE NOTE    FINAL DIAGNOSIS: SEE NOTE    COMMENTS: SEE NOTE    Cytotechnologist: SEE NOTE      Delman Cheadle, MD MPH

## 2015-08-17 NOTE — Patient Instructions (Signed)
Cutaneous Candidiasis °Cutaneous candidiasis is a condition in which there is an overgrowth of yeast (candida) on the skin. Yeast normally live on the skin, but in small enough numbers not to cause any symptoms. In certain cases, increased growth of the yeast may cause an actual yeast infection. This kind of infection usually occurs in areas of the skin that are constantly warm and moist, such as the armpits or the groin. Yeast is the most common cause of diaper rash in babies and in people who cannot control their bowel movements (incontinence). °CAUSES  °The fungus that most often causes cutaneous candidiasis is Candida albicans. Conditions that can increase the risk of getting a yeast infection of the skin include: °· Obesity. °· Pregnancy. °· Diabetes. °· Taking antibiotic medicine. °· Taking birth control pills. °· Taking steroid medicines. °· Thyroid disease. °· An iron or zinc deficiency. °· Problems with the immune system. °SYMPTOMS  °· Red, swollen area of the skin. °· Bumps on the skin. °· Itchiness. °DIAGNOSIS  °The diagnosis of cutaneous candidiasis is usually based on its appearance. Light scrapings of the skin may also be taken and viewed under a microscope to identify the presence of yeast. °TREATMENT  °Antifungal creams may be applied to the infected skin. In severe cases, oral medicines may be needed.  °HOME CARE INSTRUCTIONS  °· Keep your skin clean and dry. °· Maintain a healthy weight. °· If you have diabetes, keep your blood sugar under control. °SEEK IMMEDIATE MEDICAL CARE IF: °· Your rash continues to spread despite treatment. °· You have a fever, chills, or abdominal pain. °  °This information is not intended to replace advice given to you by your health care provider. Make sure you discuss any questions you have with your health care provider. °  °Document Released: 03/05/2011 Document Revised: 09/09/2011 Document Reviewed: 12/19/2014 °Elsevier Interactive Patient Education ©2016 Elsevier  Inc. ° °

## 2015-08-18 ENCOUNTER — Encounter: Payer: Self-pay | Admitting: Family Medicine

## 2015-08-21 LAB — PAP IG AND HPV HIGH-RISK: HPV DNA HIGH RISK: NOT DETECTED

## 2015-08-23 MED ORDER — NYSTATIN 100000 UNIT/GM EX POWD: 5.0000 g | Freq: Two times a day (BID) | CUTANEOUS | Status: DC

## 2015-08-23 MED ORDER — NYSTATIN-TRIAMCINOLONE 100000-0.1 UNIT/GM-% EX OINT: 1.0000 "application " | TOPICAL_OINTMENT | Freq: Two times a day (BID) | CUTANEOUS | Status: DC

## 2015-09-28 ENCOUNTER — Ambulatory Visit: Payer: BLUE CROSS/BLUE SHIELD | Admitting: Family Medicine

## 2015-11-01 ENCOUNTER — Other Ambulatory Visit: Payer: Self-pay | Admitting: Family Medicine

## 2015-11-07 ENCOUNTER — Telehealth: Payer: Self-pay

## 2015-11-07 ENCOUNTER — Encounter: Payer: Self-pay | Admitting: Family Medicine

## 2015-11-07 MED ORDER — SERTRALINE HCL 50 MG PO TABS: 75.0000 mg | ORAL_TABLET | Freq: Every day | ORAL | Status: DC

## 2015-11-07 NOTE — Telephone Encounter (Signed)
I went ahead and sent in her medication Dr. Brigitte Pulse.

## 2015-11-07 NOTE — Telephone Encounter (Signed)
I have not seen pt in 3 mos - I did not see her 3/30.  I have absolutely no recollection or notes about discussing her zoloft.    Appreciate refilling med for pt - she has an appt to see me on 9/7 - 4 mos - so ok to refill sertraline until then. Thanks.

## 2015-11-07 NOTE — Telephone Encounter (Signed)
SHAW - Pt saw Brigitte Pulse 09-28-15 and they discussed her medications.  She went to get her refill on her sertraline yesterday and the refill was denied.  She has missed one day and is scared of how it can effect her.  Please call [phone]

## 2015-12-23 ENCOUNTER — Other Ambulatory Visit: Payer: Self-pay | Admitting: Family Medicine

## 2015-12-26 ENCOUNTER — Ambulatory Visit (INDEPENDENT_AMBULATORY_CARE_PROVIDER_SITE_OTHER): Payer: Self-pay | Admitting: Family Medicine

## 2015-12-26 VITALS — BP 124/82 | HR 82 | Temp 98.9°F | Resp 18 | Ht 64.0 in | Wt 193.0 lb

## 2015-12-26 DIAGNOSIS — E7800 Pure hypercholesterolemia, unspecified: Secondary | ICD-10-CM

## 2015-12-26 DIAGNOSIS — E559 Vitamin D deficiency, unspecified: Secondary | ICD-10-CM

## 2015-12-26 DIAGNOSIS — R7303 Prediabetes: Secondary | ICD-10-CM

## 2015-12-26 DIAGNOSIS — I10 Essential (primary) hypertension: Secondary | ICD-10-CM

## 2015-12-26 DIAGNOSIS — E8881 Metabolic syndrome: Secondary | ICD-10-CM

## 2015-12-26 DIAGNOSIS — E669 Obesity, unspecified: Secondary | ICD-10-CM

## 2015-12-26 LAB — COMPREHENSIVE METABOLIC PANEL
ALBUMIN: 4.8 g/dL (ref 3.6–5.1)
ALT: 15 U/L (ref 6–29)
AST: 13 U/L (ref 10–35)
Alkaline Phosphatase: 58 U/L (ref 33–130)
BILIRUBIN TOTAL: 0.4 mg/dL (ref 0.2–1.2)
BUN: 13 mg/dL (ref 7–25)
CALCIUM: 9.8 mg/dL (ref 8.6–10.4)
CO2: 21 mmol/L (ref 20–31)
Chloride: 102 mmol/L (ref 98–110)
Creat: 0.88 mg/dL (ref 0.50–1.05)
Glucose, Bld: 104 mg/dL — ABNORMAL HIGH (ref 65–99)
POTASSIUM: 3.8 mmol/L (ref 3.5–5.3)
Sodium: 139 mmol/L (ref 135–146)
TOTAL PROTEIN: 7.3 g/dL (ref 6.1–8.1)

## 2015-12-26 LAB — HEMOGLOBIN A1C
Hgb A1c MFr Bld: 5.6 % (ref <5.7)
Mean Plasma Glucose: 114 mg/dL

## 2015-12-26 LAB — THYROID PANEL WITH TSH
FREE THYROXINE INDEX: 2.2 (ref 1.4–3.8)
T3 Uptake: 31 % (ref 22–35)
T4, Total: 7.2 ug/dL (ref 4.5–12.0)
TSH: 2.34 m[IU]/L

## 2015-12-26 LAB — LIPID PANEL
CHOLESTEROL: 181 mg/dL (ref 125–200)
HDL: 35 mg/dL — ABNORMAL LOW (ref >=46)
LDL Cholesterol: 113 mg/dL (ref <130)
TRIGLYCERIDES: 164 mg/dL — AB (ref <150)
Total CHOL/HDL Ratio: 5.2 Ratio — ABNORMAL HIGH (ref <=5.0)
VLDL: 33 mg/dL — ABNORMAL HIGH (ref <30)

## 2015-12-26 MED ORDER — TRIAMTERENE-HCTZ 37.5-25 MG PO TABS: 1.0000 | ORAL_TABLET | Freq: Every day | ORAL | Status: DC

## 2015-12-26 MED ORDER — LISINOPRIL 40 MG PO TABS: 40.0000 mg | ORAL_TABLET | Freq: Every day | ORAL | Status: DC

## 2015-12-26 MED ORDER — SERTRALINE HCL 50 MG PO TABS: 75.0000 mg | ORAL_TABLET | Freq: Every day | ORAL | Status: DC

## 2015-12-26 NOTE — Patient Instructions (Addendum)
IF you received an x-ray today, you will receive an invoice from [facility]. Please contact [facility] at [phone] with questions or concerns regarding your invoice.   IF you received labwork today, you will receive an invoice from [employer]. Please contact [facility] at [phone] with questions or concerns regarding your invoice.   Our billing staff will not be able to assist you with questions regarding bills from these companies.  You will be contacted with the lab results as soon as they are available. The fastest way to get your results is to activate your My Chart account. Instructions are located on the last page of this paperwork. If you have not heard from [place] regarding the results in 2 weeks, please contact this office.      Why follow it? Research shows. . Those who follow the Mediterranean diet have a reduced risk of heart disease  . The diet is associated with a reduced incidence of Parkinson's and Alzheimer's diseases . People following the diet may have longer life expectancies and lower rates of chronic diseases  . The Dietary Guidelines for Americans recommends the Mediterranean diet as an eating plan to promote health and prevent disease  What Is the Mediterranean Diet?  . Healthy eating plan based on typical foods and recipes of Mediterranean-style cooking . The diet is primarily a plant based diet; these foods should make up a majority of meals   Starches - Plant based foods should make up a majority of meals - They are an important sources of vitamins, minerals, energy, antioxidants, and fiber - Choose whole grains, foods high in fiber and minimally processed items  - Typical grain sources include wheat, oats, barley, corn, brown rice, bulgar, farro, millet, polenta, couscous  - Various types of beans include chickpeas, lentils, fava beans, black beans, white beans   Fruits  Veggies - Large quantities of  antioxidant rich fruits & veggies; 6 or more servings  - Vegetables can be eaten raw or lightly drizzled with oil and cooked  - Vegetables common to the traditional Mediterranean Diet include: artichokes, arugula, beets, broccoli, brussel sprouts, cabbage, carrots, celery, collard greens, cucumbers, eggplant, kale, leeks, lemons, lettuce, mushrooms, okra, onions, peas, peppers, potatoes, pumpkin, radishes, rutabaga, shallots, spinach, sweet potatoes, turnips, zucchini - Fruits common to the Mediterranean Diet include: apples, apricots, avocados, cherries, clementines, dates, figs, grapefruits, grapes, melons, nectarines, oranges, peaches, pears, pomegranates, strawberries, tangerines  Fats - Replace butter and margarine with healthy oils, such as olive oil, canola oil, and tahini  - Limit nuts to no more than a handful a day  - Nuts include walnuts, almonds, pecans, pistachios, pine nuts  - Limit or avoid candied, honey roasted or heavily salted nuts - Olives are central to the [employer] - can be eaten whole or used in a variety of dishes   Meats Protein - Limiting red meat: no more than a few times a month - When eating red meat: choose lean cuts and keep the portion to the size of deck of cards - Eggs: approx. 0 to 4 times a week  - Fish and lean poultry: at least 2 a week  - Healthy protein sources include, chicken, [place], lean beef, lamb - Increase intake of seafood such as tuna, salmon, trout, mackerel, shrimp, scallops - Avoid or limit high fat processed meats such as sausage and bacon  Dairy - Include moderate amounts of low fat dairy products  - Focus on healthy dairy  such as fat free yogurt, skim milk, low or reduced fat cheese - Limit dairy products higher in fat such as whole or 2% milk, cheese, ice cream  Alcohol - Moderate amounts of red wine is ok  - No more than 5 oz daily for women (all ages) and men older than age 33  - No more than 10 oz of wine daily for men younger  than 11  Other - Limit sweets and other desserts  - Use herbs and spices instead of salt to flavor foods  - Herbs and spices common to the traditional Mediterranean Diet include: basil, bay leaves, chives, cloves, cumin, fennel, garlic, lavender, marjoram, mint, oregano, parsley, pepper, rosemary, sage, savory, sumac, tarragon, thyme   It's not just a diet, it's a lifestyle:  . The Mediterranean diet includes lifestyle factors typical of those in the region  . Foods, drinks and meals are best eaten with others and savored . Daily physical activity is important for overall good health . This could be strenuous exercise like running and aerobics . This could also be more leisurely activities such as walking, housework, yard-work, or taking the stairs . Moderation is the key; a balanced and healthy diet accommodates most foods and drinks . Consider portion sizes and frequency of consumption of certain foods   Meal Ideas & Options:  . Breakfast:  o Whole wheat toast or whole wheat English muffins with peanut butter & hard boiled egg o Steel cut oats topped with apples & cinnamon and skim milk  o Fresh fruit: banana, strawberries, melon, berries, peaches  o Smoothies: strawberries, bananas, greek yogurt, peanut butter o Low fat greek yogurt with blueberries and granola  o Egg white omelet with spinach and mushrooms o Breakfast couscous: whole wheat couscous, apricots, skim milk, cranberries  . Sandwiches:  o Hummus and grilled vegetables (peppers, zucchini, squash) on whole wheat bread   o Grilled chicken on whole wheat pita with lettuce, tomatoes, cucumbers or tzatziki  o Tuna salad on whole wheat bread: tuna salad made with greek yogurt, olives, red peppers, capers, green onions o Garlic rosemary lamb pita: lamb sauted with garlic, rosemary, salt & pepper; add lettuce, cucumber, greek yogurt to pita - flavor with lemon juice and black pepper  . Seafood:  o Mediterranean grilled salmon,  seasoned with garlic, basil, parsley, lemon juice and black pepper o Shrimp, lemon, and spinach whole-grain pasta salad made with low fat greek yogurt  o Seared scallops with lemon orzo  o Seared tuna steaks seasoned salt, pepper, coriander topped with tomato mixture of olives, tomatoes, olive oil, minced garlic, parsley, green onions and cappers  . Meats:  o Herbed greek chicken salad with kalamata olives, cucumber, feta  o Red bell peppers stuffed with spinach, bulgur, lean ground beef (or lentils) & topped with feta   o Kebabs: skewers of chicken, tomatoes, onions, zucchini, squash  o [place] burgers: made with red onions, mint, dill, lemon juice, feta cheese topped with roasted red peppers . Vegetarian o Cucumber salad: cucumbers, artichoke hearts, celery, red onion, feta cheese, tossed in olive oil & lemon juice  o Hummus and whole grain pita points with a greek salad (lettuce, tomato, feta, olives, cucumbers, red onion) o Lentil soup with celery, carrots made with vegetable broth, garlic, salt and pepper  o Tabouli salad: parsley, bulgur, mint, scallions, cucumbers, tomato, radishes, lemon juice, olive oil, salt and pepper.      Diet for Metabolic Syndrome Metabolic syndrome is a disorder that  includes at least three of these conditions:  Abdominal obesity.  Too much sugar in your blood.  High blood pressure.  Higher than normal amount of fat (lipids) in your blood.  Lower than normal level of "good" cholesterol (HDL). Following a healthy diet can help to keep metabolic syndrome under control. It can also help to prevent the development of conditions that are associated with metabolic syndrome, such as diabetes, heart disease, and stroke. Along with exercise, a healthy diet:  Helps to improve the way that the body uses insulin.  Promotes weight loss. A common goal for people with this condition is to lose at least 7 to 10 percent of their starting weight. WHAT DO I NEED TO KNOW  ABOUT THIS DIET?  Use the glycemic index (GI) to plan your meals. The index tells you how quickly a food will raise your blood sugar. Choose foods that have low GI values. These foods take a longer time to raise blood sugar.  Keep track of how many calories you take in. Eating the right amount of calories will help your achieve a healthy weight.  You may want to follow a Mediterranean diet. This diet includes lots of vegetables, lean meats or fish, whole grains, fruits, and healthy oils and fats. WHAT FOODS CAN I EAT? Grains Stone-ground whole wheat. Pumpernickel bread. Whole-grain bread, crackers, tortillas, cereal, and pasta. Unsweetened oatmeal.Bulgur.Barley.Quinoa.Brown rice or wild rice. Vegetables Lettuce. Spinach. Peas. Beets. Cauliflower. Cabbage. Broccoli. Carrots. Tomatoes. Squash. Eggplant. Herbs. Peppers. Onions. Cucumbers. Brussels sprouts. Sweet potatoes. Yams. Beans. Lentils. Fruits Berries. Apples. Oranges. Grapes. Mango. Pomegranate. Kiwi. Cherries. Meats and Other Protein Sources Seafood and shellfish. Lean meats.Poultry. Tofu. Dairy Low-fat or fat-free dairy products, such as milk, yogurt, and cheese. Beverages Water. Low-fat milk. Milk alternatives, like soy milk or almond milk. Real fruit juice. Condiments Low-sugar or sugar-free ketchup, barbecue sauce, and mayonnaise. Mustard. Relish. Fats and Oils Avocado. Canola or olive oil. Nuts and nut butters.Seeds. The items listed above may not be a complete list of recommended foods or beverages. Contact your dietitian for more options.  WHAT FOODS ARE NOT RECOMMENDED? Red meat. Palm oil and coconut oil. Processed foods. Fried foods. Alcohol. Sweetened drinks, such as iced tea and soda. Sweets. Salty foods. The items listed above may not be a complete list of foods and beverages to avoid. Contact your dietitian for more information.   This information is not intended to replace advice given to you by your health care  provider. Make sure you discuss any questions you have with your health care provider.   Document Released: 11/01/2014 Document Reviewed: 11/01/2014 Elsevier Interactive Patient Education 2016 [place].  Metabolic Syndrome Metabolic syndrome is the presence of at least three factors that increase your risk of getting cardiovascular disease and diabetes. These factors are:  High blood sugar.  High blood triglyceride level.  High blood pressure.  Low levels of good blood cholesterol (high-density lipoprotein or HDL).  Excess weight around the waist. This factor is present with a waist measurement of:  More than 40 inches in men.  More than 35 inches in women. Metabolic syndrome is sometimes called insulin resistance syndrome and syndrome X. CAUSES The exact cause is not known, but genetics and lifestyle choices play a role. RISK FACTORS You are more likely to develop metabolic syndrome if:  You eat a diet high in calories and saturated fat.  You do not exercise regularly.  You are overweight.  You have a family history of metabolic syndrome.  You are Asian.  You are older in age.  You have insulin resistance.  You use any tobacco products, including cigarettes, chewing tobacco, or electronic cigarettes. SIGNS AND SYMPTOMS Metabolic syndrome has no specific symptoms. DIAGNOSIS To make a diagnosis, your health care provider will determine whether you have at least three of the factors that make up metabolic syndrome by:  Taking your blood pressure.  Measuring your waist.  Ordering blood tests. TREATMENT Treatment may include:  Lifestyle changes to reduce your risk for heart disease and stroke, such as:  Exercise.  Weight loss.  Maintaining a healthy diet.  Quitting the use of any tobacco products, including cigarettes, chewing tobacco, or electronic cigarettes.  Medicines that:  Help your body to maintain glucose control.  Reduce your blood  pressure and your blood triglyceride levels. HOME CARE INSTRUCTIONS  Exercise regularly.  Maintain a healthy diet.  Do not use any tobacco products, including cigarettes, chewing tobacco, or electronic cigarettes. If you need help quitting, ask your health care provider.  Keep all follow-up visits as directed by your health care provider. This is important.  Measure your waist regularly and record the measurement. To measure your waist:  Stand up straight.  Breathe out.  Wrap the measuring tape around the part of your waist that is just above your hipbones.  Read the measurement. SEEK MEDICAL CARE IF:  You feel very tired.  You develop excessive thirst.  You pass large quantities of urine.  You put on weight around your waist.  You have headaches over and over again.  You have a dizzy spell. SEEK IMMEDIATE MEDICAL CARE IF:  You develop sudden blurred vision.  You develop a sudden dizzy spell.  You have sudden trouble speaking or swallowing.  You have sudden weakness in your arm or leg.  You have chest pains or trouble breathing.  You feel like your heartbeat is abnormal.  You faint.   This information is not intended to replace advice given to you by your health care provider. Make sure you discuss any questions you have with your health care provider.   Document Released: 09/24/2007 Document Revised: 07/08/2014 Document Reviewed: 01/21/2014 Elsevier Interactive Patient Education [employer].

## 2015-12-26 NOTE — Progress Notes (Signed)
By signing my name below, I, Mesha Guinyard, attest that this documentation has been prepared under the direction and in the presence of Delman Cheadle, MD.  Electronically Signed: Verlee Monte, Medical Scribe. 12/26/2015. 9:24 AM.  Subjective:    Patient ID: Ariana Juarez, female    DOB: February 27, 1966, 50 y.o.   MRN: [id number]  HPI Chief Complaint  Patient presents with  . Medication Refill    TRIAMTERENE,LISINOPRIL,ZOLOFT  . BLOOD WORK    HPI Comments: Ariana Juarez is a 50 y.o. female who presents to the [facility] for a medication refill. Pt had blood work done April 2nd and was told she had low thyroid levels. Pt started taking OTC thyroid support from [employer]. Pt states she stays away from salts, bacon, sausage, and has been decreasing her carbs. Pt eats a lot of avocado and she's juicing 2 lbs of carrots, a lemon, ginger, 4 apples, and 3 beets. Pt wants to mix the fiber from the carrots in oatmeal to make cookies. Pt has a ninja blender and uses almond milk and honey for her smoothies. Pt wants to consume a lot of milk for elasticity. Pt has been walking for 15-20 mins a day and doing tybo for exercise since she stopped working. Pt is looking into getting a treadmill.  Pt was told the rash under her breast has greatly improved after taking medication.  Patient Active Problem List   Diagnosis Date Noted  . Obesity (BMI 30.0-34.9) 03/02/2015  . Polycythemia 09/27/2014  . Vitamin D deficiency 09/04/2014  . Metabolic syndrome 0000000  . Leukocytosis 09/04/2014  . Endometriosis 09/04/2014  . Gallstones 06/02/2013  . Depression 08/27/2012  . Dysmenorrhea   . HTN (hypertension) 12/19/2011  . Hypercholesterolemia 12/19/2011   Past Medical History  Diagnosis Date  . Hypertension   . Hyperlipidemia   . Depression   . Dysmenorrhea   . Anemia   . Glaucoma   . Shortness of breath     on exertion, ie climbing stairs  . GERD (gastroesophageal reflux disease)   .  Endometriosis   . 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 Dilation and curettage of uterus    . Dental surgery      bone grafting  . Laparoscopy N/A 11/12/2012    Procedure: LAPAROSCOPY DIAGNOSTIC;  Surgeon: Marylynn Pearson, MD;  Location: [facility];  Service: Gynecology;  Laterality: N/A;   Allergies  Allergen Reactions  . Mobic [Meloxicam]     Patient states she "felt her heart beat" while on med   Prior to Admission medications   Medication Sig Start Date End Date Taking? Authorizing Provider  Cholecalciferol (VITAMIN D-3 PO) Take by mouth.    Historical Provider, MD  Coenzyme Q10 (COQ10) 100 MG CAPS Take 1 capsule by mouth daily.    Historical Provider, MD  Flaxseed, Linseed, (FLAX SEEDS PO) Take 1 tablet by mouth daily. Reported on 08/17/2015    Historical Provider, MD  folic acid (FOLVITE) 1 MG tablet Take 1 mg by mouth daily. Reported on 08/17/2015    Historical Provider, MD  lisinopril (PRINIVIL,ZESTRIL) 40 MG tablet TAKE 1 TABLET (40 MG TOTAL) BY MOUTH DAILY. 12/25/15   Shawnee Knapp, MD  medroxyPROGESTERone (DEPO-PROVERA) 150 MG/ML injection  05/18/13   Historical Provider, MD  Menaquinone-7 (VITAMIN K2 PO) Take by mouth.    Historical Provider, MD  Nutritional Supplements (DHEA PO) Take by mouth.    Historical Provider, MD  nystatin (MYCOSTATIN/NYSTOP) 100000 UNIT/GM POWD Apply 5 g topically 2 (two) times daily. 08/23/15   Shawnee Knapp, MD  nystatin-triamcinolone ointment [facility]) Apply 1 application topically 2 (two) times daily. 08/23/15   Shawnee Knapp, MD  OVER THE COUNTER MEDICATION Pregnenolone 75 mg taking daily    Historical Provider, MD  OVER THE COUNTER MEDICATION Tumeric  500 mg taking daily    Historical Provider, MD  OVER THE COUNTER MEDICATION Methylation Complete taking daily    Historical Provider, MD  Probiotic Product (ADVANCED PROBIOTIC 10 PO) Take by mouth.     Historical Provider, MD  sertraline (ZOLOFT) 50 MG tablet Take 1.5 tablets (75 mg total) by mouth daily. 11/07/15   Shawnee Knapp, MD  Travoprost, BAK Free, (TRAVATAN) 0.004 % SOLN ophthalmic solution Place 1 drop into the left eye at bedtime.    Historical Provider, MD  triamterene-hydrochlorothiazide (MAXZIDE-25) 37.5-25 MG tablet TAKE 1 TABLET BY MOUTH DAILY. 12/25/15   Shawnee Knapp, MD   Social History   Social History  . Marital Status: Divorced    Spouse Name: n/a  . Number of Children: 0  . Years of Education: 12   Occupational History  . training assistant [employer]   Social History Main Topics  . Smoking status: Never Smoker   . Smokeless tobacco: Never Used  . Alcohol Use: Yes     Comment: one drink every 2 years  . Drug Use: No  . Sexual Activity:    Partners: Male   Other Topics Concern  . Not on file   Social History Narrative   Lives with her mother and brother, and her maternal uncle.   Depression screen [facility] 2/9 12/26/2015 08/17/2015 06/29/2015 03/02/2015  Decreased Interest 0 0 0 0  Down, Depressed, Hopeless 0 0 0 (No Data)  PHQ - 2 Score 0 0 0 0   Review of Systems  Constitutional: Positive for fatigue. Negative for activity change, appetite change and unexpected weight change.  Respiratory: Negative for cough, chest tightness and shortness of breath.   Cardiovascular: Negative for chest pain, palpitations and leg swelling.  Skin: Negative for rash.  Neurological: Negative for headaches.  Hematological: Negative for adenopathy.  Psychiatric/Behavioral: Negative for dysphoric mood.   Objective:  BP 124/82 mmHg  Pulse 82  Temp(Src) 98.9 F (37.2 C) (Oral)  Resp 18  Ht 5\' 4"  (1.626 m)  Wt 193 lb (87.544 kg)  BMI 33.11 kg/m2  SpO2 96%  LMP 11/27/2015  Physical Exam  Constitutional: She appears well-developed and well-nourished. No distress.  HENT:  Head: Normocephalic and atraumatic.  Eyes: Conjunctivae are normal.  Neck: Neck supple.  Cardiovascular: Normal  rate, regular rhythm, S1 normal, S2 normal and normal heart sounds.  Exam reveals no gallop and no friction rub.   No murmur heard. Pulmonary/Chest: Effort normal and breath sounds normal. No respiratory distress. She has no wheezes. She has no rales.  Neurological: She is alert.  Skin: Skin is warm and dry.  Psychiatric: She has a normal mood and affect. Her behavior is normal.  Nursing note and vitals reviewed.  Assessment & Plan:   1. Essential hypertension   2. Hypercholesterolemia   3. Metabolic syndrome   4. Obesity (BMI 30.0-34.9)   5. Prediabetes   6. Vitamin D deficiency     Orders Placed This Encounter  Procedures  . Lipid panel    Order Specific Question:  Has the patient fasted?    Answer:  Yes  . Comprehensive metabolic panel    Order Specific Question:  Has the patient fasted?    Answer:  Yes  . Hemoglobin A1c  . Thyroid Panel With TSH  . VITAMIN D 25 Hydroxy (Vit-D Deficiency, Fractures)    Meds ordered this encounter  Medications  . sertraline (ZOLOFT) 50 MG tablet    Sig: Take 1.5 tablets (75 mg total) by mouth daily.    Dispense:  135 tablet    Refill:  3  . triamterene-hydrochlorothiazide (MAXZIDE-25) 37.5-25 MG tablet    Sig: Take 1 tablet by mouth daily.    Dispense:  90 tablet    Refill:  3  . lisinopril (PRINIVIL,ZESTRIL) 40 MG tablet    Sig: Take 1 tablet (40 mg total) by mouth daily.    Dispense:  90 tablet    Refill:  3    I personally performed the services described in this documentation, which was scribed in my presence. The recorded information has been reviewed and considered, and addended by me as needed.   Delman Cheadle, M.D.  [facility] [address] [place], [place] [address] [phone] phone [phone] fax  01/01/2016 7:16 AM

## 2015-12-27 LAB — VITAMIN D 25 HYDROXY (VIT D DEFICIENCY, FRACTURES): VIT D 25 HYDROXY: 61 ng/mL (ref 30–100)

## 2016-03-06 NOTE — Progress Notes (Signed)
Subjective:    Patient ID: Ariana Juarez, female    DOB: 03/22/1966, 50 y.o.   MRN: [id number] Chief Complaint  Patient presents with  . Follow-up    HPI  Ariana Juarez who is here for refills on her chronic medications.   Last saw her 2 mos prior for the same.  Since Ariana Juarez has retired she has been able to devote much more attention and time to healthy living. She prefers to treat herself using homeopathic methods when available and appropriate so does see other alternative medicine providers at times.  Exercises daily, lots of smoothies and juicing, very heart healthy diet.  HTN: On lisinopril 40 and maxzide 37.5 -25 HPL:  Lipids LDL 163 -> 113, trig 221 -> 164, total chol 239 ->181 in the past year!!! Metabolic syndrome/obesity: hgba1c 5.9 -> 5.6 in the past yr Vit D Def: - Has resolved with level 13 -> 61 over the past year. She is alternating with 10,000 with 20,000u daily. H/O abnml thyroid blood tests - alternative provider told pt her thyroid was underactive but last labs nml after she started thyroid support supp Polycythemia/leukocytosis: wbc and hgb are chronically mildly elev, plts nml. Path smear was relatively unremarkable.  Saw hematology Dr. Julien Nordmann 09/27/2014 who suspected that the leukocytosis was reactive secondary to endometriosis and hormonal treatment (on Depo-Provera) though did further testing to r/o a myeloproliverative disorder - however, pt never kept recommended f/u appt to review labs due to cost - however, on my review of the labs and genetic testing done, it appears nml.  Mood d/o: On zoloft 75 mg qd.  Has noticed that she is feeling dizzy or lightheaded when she changes position.  She has been checking her BP at home and on the wrist cuff 104/60s, then if she is pinched it will go up to 150s/70s. She is drinking a lot of water and ice green tea.  Getting some protein and is still juicing so getting plenty of K in her diet  Would like flu vaccine  today  Depression screen [facility] 2/9 12/26/2015 08/17/2015 06/29/2015 03/02/2015  Decreased Interest 0 0 0 0  Down, Depressed, Hopeless 0 0 0 (No Data)  PHQ - 2 Score 0 0 0 0     Past Medical History:  Diagnosis Date  . Anemia   . Depression   . Dysmenorrhea   . Endometriosis   . GERD (gastroesophageal reflux disease)   . Glaucoma   . Hyperlipidemia   . Hypertension   . Shortness of breath    on exertion, ie climbing stairs  . 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 DENTAL SURGERY     bone grafting  . DILATION AND CURETTAGE OF UTERUS    . LAPAROSCOPY N/A 11/12/2012   Procedure: LAPAROSCOPY DIAGNOSTIC;  Surgeon: Marylynn Pearson, MD;  Location: [facility];  Service: Gynecology;  Laterality: N/A;   Current Outpatient Prescriptions on File Prior to Visit  Medication Sig Dispense Refill  . Cholecalciferol (VITAMIN D-3 PO) Take by mouth.    . Coenzyme Q10 (COQ10) 100 MG CAPS Take 1 capsule by mouth daily. Reported on 12/26/2015    . Flaxseed, Linseed, (FLAX SEEDS PO) Take 1 tablet by mouth daily. Reported on [id number]    . folic acid (FOLVITE) 1 MG tablet Take 1 mg by mouth daily. Reported on 08/17/2015    .  lisinopril (PRINIVIL,ZESTRIL) 40 MG tablet Take 1 tablet (40 mg total) by mouth daily. 90 tablet 3  . Menaquinone-7 (VITAMIN K2 PO) Take by mouth.    . Nutritional Supplements (DHEA PO) Take by mouth.    . nystatin (MYCOSTATIN/NYSTOP) 100000 UNIT/GM POWD Apply 5 g topically 2 (two) times daily. 180 g 5  . nystatin-triamcinolone ointment (MYCOLOG) Apply 1 application topically 2 (two) times daily. 60 g 1  . OVER THE COUNTER MEDICATION Pregnenolone 75 mg taking daily    . OVER THE COUNTER MEDICATION Tumeric  500 mg taking daily    . OVER THE COUNTER MEDICATION Methylation Complete taking daily    . Probiotic Product (ADVANCED PROBIOTIC 10 PO) Take by mouth.    . sertraline  (ZOLOFT) 50 MG tablet Take 1.5 tablets (75 mg total) by mouth daily. 135 tablet 3  . Travoprost, BAK Free, (TRAVATAN) 0.004 % SOLN ophthalmic solution Place 1 drop into the left eye at bedtime.    . triamterene-hydrochlorothiazide (MAXZIDE-25) 37.5-25 MG tablet Take 1 tablet by mouth daily. 90 tablet 3  . medroxyPROGESTERone (DEPO-PROVERA) 150 MG/ML injection      No current facility-administered medications on file prior to visit.    Allergies  Allergen Reactions  . Mobic [Meloxicam]     Patient states she "felt her heart beat" while on med   Family History  Problem Relation Age of Onset  . Cancer Mother 1    uterine  . Cancer Father     lung/liver  . Spina bifida Brother     mild  . Crohn's disease Brother 56  . Colon cancer Neg Hx    Social History   Social History  . Marital status: Divorced    Spouse name: n/a  . Number of children: 0  . Years of education: 12   Occupational History  . training assistant [employer]   Social History Main Topics  . Smoking status: Never Smoker  . Smokeless tobacco: Never Used  . Alcohol use Yes     Comment: one drink every 2 years  . Drug use: No  . Sexual activity: Not Currently    Partners: Male   Other Topics Concern  . None   Social History Narrative   Lives with her mother and brother, and her maternal uncle.    Review of Systems  Constitutional: Positive for activity change and appetite change. Negative for chills, diaphoresis, fatigue, fever and unexpected weight change.  Eyes: Negative for visual disturbance.  Respiratory: Negative for cough and shortness of breath.   Cardiovascular: Negative for chest pain, palpitations and leg swelling.  Genitourinary: Negative for decreased urine volume.  Neurological: Positive for dizziness and light-headedness. Negative for syncope and headaches.  Hematological: Does not bruise/bleed easily.       Objective:   Physical Exam  Constitutional: She is oriented to person, place,  and time. She appears well-developed and well-nourished. No distress.  HENT:  Head: Normocephalic and atraumatic.  Right Ear: External ear normal.  Left Ear: External ear normal.  Eyes: Conjunctivae are normal. No scleral icterus.  Neck: Normal range of motion. Neck supple. No thyromegaly present.  Cardiovascular: Normal rate, regular rhythm, normal heart sounds and intact distal pulses.   Pulmonary/Chest: Effort normal and breath sounds normal. No respiratory distress.  Musculoskeletal: She exhibits no edema.  Lymphadenopathy:    She has no cervical adenopathy.  Neurological: She is alert and oriented to person, place, and time.  Skin: Skin is warm and dry. She is not  diaphoretic. No erythema.  Psychiatric: She has a normal mood and affect. Her behavior is normal.    BP 112/74   Pulse 74   Temp 98.5 F (36.9 C) (Oral)   Resp 18   Ht 5\' 4"  (1.626 m)   Wt 192 lb 12.8 oz (87.5 kg)   LMP 06/01/2015   SpO2 99%   BMI 33.09 kg/m   Orthostatic vital signs negative (though systolic bp did drop 22 mmhg.    Assessment & Plan:  Continue current vit D supplement or even back off a little since has increased by 48u in the past yr - she will decrease this of to 10k qd.  Will do cbc, and vit D in 1-2 months, and lfts - enture future orders for lab only visit with this.   1. Essential hypertension   2. Hypercholesterolemia   3. Metabolic syndrome   4. Obesity (BMI 30.0-34.9)   5. Medication management   6. Flu vaccine need   7. Orthostatic lightheadedness   8. Thrush     Orders Placed This Encounter  Procedures  . Flu Vaccine QUAD 36+ mos IM  . Orthostatic vital signs      Delman Cheadle, M.D.  [facility] [address] [place], [place] [address] [phone] phone [phone] fax  03/13/16 9:27 AM

## 2016-03-07 ENCOUNTER — Encounter: Payer: Self-pay | Admitting: Family Medicine

## 2016-03-07 ENCOUNTER — Ambulatory Visit (INDEPENDENT_AMBULATORY_CARE_PROVIDER_SITE_OTHER): Payer: Self-pay | Admitting: Family Medicine

## 2016-03-07 VITALS — BP 112/74 | HR 74 | Temp 98.5°F | Resp 18 | Ht 64.0 in | Wt 192.8 lb

## 2016-03-07 DIAGNOSIS — E669 Obesity, unspecified: Secondary | ICD-10-CM

## 2016-03-07 DIAGNOSIS — E7800 Pure hypercholesterolemia, unspecified: Secondary | ICD-10-CM

## 2016-03-07 DIAGNOSIS — Z79899 Other long term (current) drug therapy: Secondary | ICD-10-CM

## 2016-03-07 DIAGNOSIS — B370 Candidal stomatitis: Secondary | ICD-10-CM

## 2016-03-07 DIAGNOSIS — E8881 Metabolic syndrome: Secondary | ICD-10-CM

## 2016-03-07 DIAGNOSIS — I10 Essential (primary) hypertension: Secondary | ICD-10-CM

## 2016-03-07 DIAGNOSIS — Z23 Encounter for immunization: Secondary | ICD-10-CM

## 2016-03-07 DIAGNOSIS — E559 Vitamin D deficiency, unspecified: Secondary | ICD-10-CM

## 2016-03-07 DIAGNOSIS — R42 Dizziness and giddiness: Secondary | ICD-10-CM

## 2016-03-07 MED ORDER — FLUCONAZOLE 100 MG PO TABS: 100.0000 mg | ORAL_TABLET | Freq: Once | ORAL | 1 refills | Status: AC

## 2016-03-07 MED ORDER — LISINOPRIL 40 MG PO TABS: 20.0000 mg | ORAL_TABLET | Freq: Every day | ORAL | 3 refills | Status: DC

## 2016-03-07 NOTE — Patient Instructions (Addendum)
IF you received an x-ray today, you will receive an invoice from [facility]. Please contact [facility] at [phone] with questions or concerns regarding your invoice.   IF you received labwork today, you will receive an invoice from [employer]. Please contact [facility] at [phone] with questions or concerns regarding your invoice.   Our billing staff will not be able to assist you with questions regarding bills from these companies.  You will be contacted with the lab results as soon as they are available. The fastest way to get your results is to activate your My Chart account. Instructions are located on the last page of this paperwork. If you have not heard from [place] regarding the results in 2 weeks, please contact this office.     Thrush, Adult Ritta Slot, also called oral candidiasis, is a fungal infection that develops in the mouth and throat and on the tongue. It causes white patches to form on the mouth and tongue. Ritta Slot is most common in older adults, but it can occur at any age.  Many cases of thrush are mild, but this infection can also be more serious. Ritta Slot can be a recurring problem for people who have chronic illnesses or who take medicines that limit the body's ability to fight infection. Because these people have difficulty fighting infections, the fungus that causes thrush can spread throughout the body. This can cause life-threatening blood or organ infections. CAUSES  Ritta Slot is usually caused by a yeast called Candida albicans. This fungus is normally present in small amounts in the mouth and on other mucous membranes. It usually causes no harm. However, when conditions are present that allow the fungus to grow uncontrolled, it invades surrounding tissues and becomes an infection. Less often, other Candida species can also lead to thrush.  RISK FACTORS Ritta Slot is more likely to develop in the following people:  People with  an impaired ability to fight infection (weakened immune system).   Older adults.   People with HIV.   People with diabetes.   People with dry mouth (xerostomia).   Pregnant women.   People with poor dental care, especially those who have false teeth.   People who use antibiotic medicines.  SIGNS AND SYMPTOMS  Ritta Slot can be a mild infection that causes no symptoms. If symptoms develop, they may include:   A burning feeling in the mouth and throat. This can occur at the start of a thrush infection.   White patches that adhere to the mouth and tongue. The tissue around the patches may be red, raw, and painful. If rubbed (during tooth brushing, for example), the patches and the tissue of the mouth may bleed easily.   A bad taste in the mouth or difficulty tasting foods.   Cottony feeling in the mouth.   Pain during eating and swallowing. DIAGNOSIS  Your health care provider can usually diagnose thrush by looking in your mouth and asking you questions about your health.  TREATMENT  Medicines that help prevent the growth of fungi (antifungals) are the standard treatment for thrush. These medicines are either applied directly to the affected area (topical) or swallowed (oral). The treatment will depend on the severity of the condition.  Mild Thrush Mild cases of thrush may clear up with the use of an antifungal mouth rinse or lozenges. Treatment usually lasts about 14 days.  Moderate to Severe Thrush  More severe thrush infections that have spread to the esophagus are treated with an  oral antifungal medicine. A topical antifungal medicine may also be used.   For some severe infections, a treatment period longer than 14 days may be needed.   Oral antifungal medicines are almost never used during pregnancy because the fetus may be harmed. However, if a pregnant woman has a rare, severe thrush infection that has spread to her blood, oral antifungal medicines may be used. In  this case, the risk of harm to the mother and fetus from the severe thrush infection may be greater than the risk posed by the use of antifungal medicines.  Persistent or Recurrent Thrush For cases of thrush that do not go away or keep coming back, treatment may involve the following:   Treatment may be needed twice as [place] as the symptoms last.   Treatment will include both oral and topical antifungal medicines.   People with weakened immune systems can take an antifungal medicine on a continuous basis to prevent thrush infections.  It is important to treat conditions that make you more likely to get thrush, such as diabetes or HIV.  HOME CARE INSTRUCTIONS   Only take over-the-counter or prescription medicine as directed by your health care provider. Talk to your health care provider about an over-the-counter medicine called gentian violet, which kills bacteria and fungi.   Eat plain, unflavored yogurt as directed by your health care provider. Check the label to make sure the yogurt contains live cultures. This yogurt can help healthy bacteria grow in the mouth that can stop the growth of the fungus that causes thrush.   Try these measures to help reduce the discomfort of thrush:   Drink cold liquids such as water or iced tea.   Try flavored ice treats or frozen juices.   Eat foods that are easy to swallow, such as gelatin, ice cream, or custard.   If the patches in your mouth are painful, try drinking from a straw.   Rinse your mouth several times a day with a warm saltwater rinse. You can make the saltwater mixture with 1 tsp (6 g) of salt in 8 fl oz (0.2 L) of warm water.   If you wear dentures, remove the dentures before going to bed, brush them vigorously, and soak them in a cleaning solution as directed by your health care provider.   Women who are breastfeeding should clean their nipples with an antifungal medicine as directed by their health care provider. Dry the  nipples after breastfeeding. Applying lanolin-containing body lotion may help relieve nipple soreness.  SEEK MEDICAL CARE IF:  Your symptoms are getting worse or are not improving within 7 days of starting treatment.   You have symptoms of spreading infection, such as white patches on the skin outside of the mouth.   You are nursing and you have redness, burning, or pain in the nipples that is not relieved with treatment.  MAKE SURE YOU:  Understand these instructions.  Will watch your condition.  Will get help right away if you are not doing well or get worse.   This information is not intended to replace advice given to you by your health care provider. Make sure you discuss any questions you have with your health care provider.   Document Released: 03/12/2004 Document Revised: 07/08/2014 Document Reviewed: 01/18/2013 Elsevier Interactive Patient Education 2016 [place] Your High Blood Pressure Blood pressure is a measurement of how forceful your blood is pressing against the walls of the arteries. Arteries are muscular tubes within the circulatory  system. Blood pressure does not stay the same. Blood pressure rises when you are active, excited, or nervous; and it lowers during sleep and relaxation. If the numbers measuring your blood pressure stay above normal most of the time, you are at risk for health problems. High blood pressure (hypertension) is a Brunetto-term (chronic) condition in which blood pressure is elevated. A blood pressure reading is recorded as two numbers, such as 120 over 80 (or 120/80). The first, higher number is called the systolic pressure. It is a measure of the pressure in your arteries as the heart beats. The second, lower number is called the diastolic pressure. It is a measure of the pressure in your arteries as the heart relaxes between beats.  Keeping your blood pressure in a normal range is important to your overall health and prevention of  health problems, such as heart disease and stroke. When your blood pressure is uncontrolled, your heart has to work harder than normal. High blood pressure is a very common condition in adults because blood pressure tends to rise with age. Men and women are equally likely to have hypertension but at different times in life. Before age 66, men are more likely to have hypertension. After 52 years of age, women are more likely to have it. Hypertension is especially common in African Americans. This condition often has no signs or symptoms. The cause of the condition is usually not known. Your caregiver can help you come up with a plan to keep your blood pressure in a normal, healthy range. BLOOD PRESSURE STAGES Blood pressure is classified into four stages: normal, prehypertension, stage 1, and stage 2. Your blood pressure reading will be used to determine what type of treatment, if any, is necessary. Appropriate treatment options are tied to these four stages:  Normal  Systolic pressure (mm Hg): below 120.  Diastolic pressure (mm Hg): below 80. Prehypertension  Systolic pressure (mm Hg): 120 to 139.  Diastolic pressure (mm Hg): 80 to 89. Stage1  Systolic pressure (mm Hg): 140 to 159.  Diastolic pressure (mm Hg): 90 to 99. Stage2  Systolic pressure (mm Hg): 160 or above.  Diastolic pressure (mm Hg): 100 or above. RISKS RELATED TO HIGH BLOOD PRESSURE Managing your blood pressure is an important responsibility. Uncontrolled high blood pressure can lead to:  A heart attack.  A stroke.  A weakened blood vessel (aneurysm).  Heart failure.  Kidney damage.  Eye damage.  Metabolic syndrome.  Memory and concentration problems. HOW TO MANAGE YOUR BLOOD PRESSURE Blood pressure can be managed effectively with lifestyle changes and medicines (if needed). Your caregiver will help you come up with a plan to bring your blood pressure within a normal range. Your plan should include the  following: Education  Read all information provided by your caregivers about how to control blood pressure.  Educate yourself on the latest guidelines and treatment recommendations. New research is always being done to further define the risks and treatments for high blood pressure. Lifestylechanges  Control your weight.  Avoid smoking.  Stay physically active.  Reduce the amount of salt in your diet.  Reduce stress.  Control any chronic conditions, such as high cholesterol or diabetes.  Reduce your alcohol intake. Medicines  Several medicines (antihypertensive medicines) are available, if needed, to bring blood pressure within a normal range. Communication  Review all the medicines you take with your caregiver because there may be side effects or interactions.  Talk with your caregiver about your diet, exercise habits,  and other lifestyle factors that may be contributing to high blood pressure.  See your caregiver regularly. Your caregiver can help you create and adjust your plan for managing high blood pressure. RECOMMENDATIONS FOR TREATMENT AND FOLLOW-UP  The following recommendations are based on current guidelines for managing high blood pressure in nonpregnant adults. Use these recommendations to identify the proper follow-up period or treatment option based on your blood pressure reading. You can discuss these options with your caregiver.  Systolic pressure of 123456 to [id number] or diastolic pressure of 80 to 89: Follow up with your caregiver as directed.  Systolic pressure of [id number] to 0000000 or diastolic pressure of 90 to 100: Follow up with your caregiver within 2 months.  Systolic pressure above 0000000 or diastolic pressure above [id number]: Follow up with your caregiver within 1 month.  Systolic pressure above 99991111 or diastolic pressure above A999333: Consider antihypertensive therapy; follow up with your caregiver within 1 week.  Systolic pressure above A999333 or diastolic pressure above 123456:  Begin antihypertensive therapy; follow up with your caregiver within 1 week.   This information is not intended to replace advice given to you by your health care provider. Make sure you discuss any questions you have with your health care provider.   Document Released: 03/11/2012 Document Reviewed: 03/11/2012 Elsevier Interactive Patient Education [employer].

## 2016-12-23 ENCOUNTER — Other Ambulatory Visit: Payer: Self-pay | Admitting: Family Medicine

## 2016-12-28 ENCOUNTER — Other Ambulatory Visit: Payer: Self-pay | Admitting: Family Medicine

## 2016-12-28 ENCOUNTER — Encounter: Payer: Self-pay | Admitting: Family Medicine

## 2017-01-10 ENCOUNTER — Encounter: Payer: Self-pay | Admitting: Family Medicine

## 2017-01-10 ENCOUNTER — Ambulatory Visit (INDEPENDENT_AMBULATORY_CARE_PROVIDER_SITE_OTHER): Payer: BLUE CROSS/BLUE SHIELD | Admitting: Family Medicine

## 2017-01-10 VITALS — BP 121/77 | HR 80 | Temp 99.1°F | Resp 16 | Ht 64.0 in | Wt 205.4 lb

## 2017-01-10 DIAGNOSIS — E7800 Pure hypercholesterolemia, unspecified: Secondary | ICD-10-CM | POA: Diagnosis not present

## 2017-01-10 DIAGNOSIS — E8881 Metabolic syndrome: Secondary | ICD-10-CM | POA: Diagnosis not present

## 2017-01-10 DIAGNOSIS — I10 Essential (primary) hypertension: Secondary | ICD-10-CM

## 2017-01-10 DIAGNOSIS — E559 Vitamin D deficiency, unspecified: Secondary | ICD-10-CM

## 2017-01-10 DIAGNOSIS — N809 Endometriosis, unspecified: Secondary | ICD-10-CM

## 2017-01-10 DIAGNOSIS — E669 Obesity, unspecified: Secondary | ICD-10-CM

## 2017-01-10 DIAGNOSIS — D72829 Elevated white blood cell count, unspecified: Secondary | ICD-10-CM | POA: Diagnosis not present

## 2017-01-10 DIAGNOSIS — Z5181 Encounter for therapeutic drug level monitoring: Secondary | ICD-10-CM

## 2017-01-10 DIAGNOSIS — M533 Sacrococcygeal disorders, not elsewhere classified: Secondary | ICD-10-CM

## 2017-01-10 DIAGNOSIS — E66811 Obesity, class 1: Secondary | ICD-10-CM

## 2017-01-10 DIAGNOSIS — D751 Secondary polycythemia: Secondary | ICD-10-CM

## 2017-01-10 DIAGNOSIS — R5383 Other fatigue: Secondary | ICD-10-CM | POA: Diagnosis not present

## 2017-01-10 DIAGNOSIS — F330 Major depressive disorder, recurrent, mild: Secondary | ICD-10-CM

## 2017-01-10 DIAGNOSIS — R1032 Left lower quadrant pain: Secondary | ICD-10-CM | POA: Diagnosis not present

## 2017-01-10 MED ORDER — TRIAMTERENE-HCTZ 37.5-25 MG PO TABS: 1.0000 | ORAL_TABLET | Freq: Every day | ORAL | 3 refills | Status: DC

## 2017-01-10 MED ORDER — LISINOPRIL 20 MG PO TABS: 20.0000 mg | ORAL_TABLET | Freq: Every day | ORAL | 3 refills | Status: DC

## 2017-01-10 MED ORDER — SERTRALINE HCL 50 MG PO TABS
75.0000 mg | ORAL_TABLET | Freq: Every day | ORAL | 3 refills | Status: DC
Start: 2017-01-10 — End: 2017-11-06

## 2017-01-10 NOTE — Patient Instructions (Addendum)
IF you received an x-ray today, you will receive an invoice from [facility]. Please contact [facility] at [phone] with questions or concerns regarding your invoice.   IF you received labwork today, you will receive an invoice from [facility]. Please contact [facility] at [phone] with questions or concerns regarding your invoice.   Our billing staff will not be able to assist you with questions regarding bills from these companies.  You will be contacted with the lab results as soon as they are available. The fastest way to get your results is to activate your My Chart account. Instructions are located on the last page of this paperwork. If you have not heard from [place] regarding the results in 2 weeks, please contact this office.    Sacroiliac Joint Dysfunction Sacroiliac joint dysfunction is a condition that causes inflammation on one or both sides of the sacroiliac (SI) joint. The SI joint connects the lower part of the spine (sacrum) with the two upper portions of the pelvis (ilium). This condition causes deep aching or burning pain in the low back. In some cases, the pain may also spread into one or both buttocks or hips or spread down the legs. What are the causes? This condition may be caused by:  Pregnancy. During pregnancy, extra stress is put on the SI joints because the pelvis widens.  Injury, such as: ? Car accidents. ? Sport-related injuries. ? Work-related injuries.  Having one leg that is shorter than the other.  Conditions that affect the joints, such as: ? Rheumatoid arthritis. ? Gout. ? Psoriatic arthritis. ? Joint infection (septic arthritis).  Sometimes, the cause of SI joint dysfunction is not known. What are the signs or symptoms? Symptoms of this condition include:  Aching or burning pain in the lower back. The pain may also spread to other areas, such as: ? Buttocks. ? Groin. ? Thighs and legs.  Muscle spasms in or around the  painful areas.  Increased pain when standing, walking, running, stair climbing, bending, or lifting.  How is this diagnosed? Your health care provider will do a physical exam and take your medical history. During the exam, the health care provider may move one or both of your legs to different positions to check for pain. Various tests may be done to help verify the diagnosis, including:  Imaging tests to look for other causes of pain. These may include: ? MRI. ? CT scan. ? Bone scan.  Diagnostic injection. A numbing medicine is injected into the SI joint using a needle. If the pain is temporarily improved or stopped after the injection, this can indicate that SI joint dysfunction is the problem.  How is this treated? Treatment may vary depending on the cause and severity of your condition. Treatment options may include:  Applying ice or heat to the lower back area. This can help to reduce pain and muscle spasms.  Medicines to relieve pain or inflammation or to relax the muscles.  Wearing a back brace (sacroiliac brace) to help support the joint while your back is healing.  Physical therapy to increase muscle strength around the joint and flexibility at the joint. This may also involve learning proper body positions and ways of moving to relieve stress on the joint.  Direct manipulation of the SI joint.  Injections of steroid medicine into the joint in order to reduce pain and swelling.  Radiofrequency ablation to burn away nerves that are carrying pain messages from the joint.  Use of  a device that provides electrical stimulation in order to reduce pain at the joint.  Surgery to put in screws and plates that limit or prevent joint motion. This is rare.  Follow these instructions at home:  Rest as needed. Limit your activities as directed by your health care provider.  Take medicines only as directed by your health care provider.  If directed, apply ice to the affected  area: ? Put ice in a plastic bag. ? Place a towel between your skin and the bag. ? Leave the ice on for 20 minutes, 2-3 times per day.  Use a heating pad or a moist heat pack as directed by your health care provider.  Exercise as directed by your health care provider or physical therapist.  Keep all follow-up visits as directed by your health care provider. This is important. Contact a health care provider if:  Your pain is not controlled with medicine.  You have a fever.  You have increasingly severe pain. Get help right away if:  You have weakness, numbness, or tingling in your legs or feet.  You lose control of your bladder or bowel. This information is not intended to replace advice given to you by your health care provider. Make sure you discuss any questions you have with your health care provider. Document Released: 09/13/2008 Document Revised: 11/23/2015 Document Reviewed: 02/22/2014 Elsevier Interactive Patient Education  2018 [place]. Sacroiliac (SI) Joint Injection Patient Information  Description: The sacroiliac joint connects the scrum (very low back and tailbone) to the ilium (a pelvic bone which also forms half of the hip joint).  Normally this joint experiences very little motion.  When this joint becomes inflamed or unstable low back and or hip and pelvis pain may result.  Injection of this joint with local anesthetics (numbing medicines) and steroids can provide diagnostic information and reduce pain.  This injection is performed with the aid of x-ray guidance into the tailbone area while you are lying on your stomach.   You may experience an electrical sensation down the leg while this is being done.  You may also experience numbness.  We also may ask if we are reproducing your normal pain during the injection.  Conditions which may be treated SI injection:   Low back, buttock, hip or leg pain  Preparation for the Injection:  1. Do not eat any solid food  or dairy products within 8 hours of your appointment.  2. You may drink clear liquids up to 3 hours before appointment.  Clear liquids include water, black coffee, juice or soda.  No milk or cream please. 3. You may take your regular medications, including pain medications with a sip of water before your appointment.  Diabetics should hold regular insulin (if take separately) and take 1/2 normal NPH dose the morning of the procedure.  Carry some sugar containing items with you to your appointment. 4. A driver must accompany you and be prepared to drive you home after your procedure. 5. Bring all of your current medications with you. 6. An IV may be inserted and sedation may be given at the discretion of the physician. 7. A blood pressure cuff, EKG and other monitors will often be applied during the procedure.  Some patients may need to have extra oxygen administered for a short period.  8. You will be asked to provide medical information, including your allergies, prior to the procedure.  We must know immediately if you are taking blood thinners (like Coumadin/Warfarin) or  if you are allergic to IV iodine contrast (dye).  We must know if you could possible be pregnant.  Possible side effects:   Bleeding from needle site  Infection (rare, may require surgery)  Nerve injury (rare)  Numbness & tingling (temporary)  A brief convulsion or seizure  Light-headedness (temporary)  Pain at injection site (several days)  Decreased blood pressure (temporary)  Weakness in the leg (temporary)   Call if you experience:   New onset weakness or numbness of an extremity below the injection site that last more than 8 hours.  Hives or difficulty breathing ( go to the emergency room)  Inflammation or drainage at the injection site  Any new symptoms which are concerning to you  Please note:  Although the local anesthetic injected can often make your back/ hip/ buttock/ leg feel good for several  hours after the injections, the pain will likely return.  It takes 3-7 days for steroids to work in the sacroiliac area.  You may not notice any pain relief for at least that one week.  If effective, we will often do a series of three injections spaced 3-6 weeks apart to maximally decrease your pain.  After the initial series, we generally will wait some months before a repeat injection of the same type.  If you have any questions, please call [phone] [facility]

## 2017-01-10 NOTE — Progress Notes (Signed)
Subjective:  By signing my name below, I, Moises Blood, attest that this documentation has been prepared under the direction and in the presence of Delman Cheadle, MD. Electronically Signed: Moises Blood, [place]. 01/10/2017 , 9:21 AM .  Patient was seen in Room 11 .   Patient ID: Ariana Juarez, female    DOB: 1966-05-12, 51 y.o.   MRN: [id number] Chief Complaint  Patient presents with  . Medication Refill    Lisinopril, zoloft, maxzide    HPI Ariana Juarez is a 51 y.o. female who presents to [place] [facility] for medication refill. Last saw her 10 months prior. She is fasting today.   She takes lisinopril and maxzide for BP control. She prefers to treat herself using homeopathic methods when available and appropriate so does see other alternative medicine providers at times. She has put in more time into improving her diet and exercises daily; because of that, her LDL cholesterol had improved from 163 to 113 last year, and her hemoglobin A1C from 5.9 to 5.6.   She was told she had underactive thyroid despite normal labs, and takes thyroid supplement. She has a chronic leukocytosis and polycythemia, suspected by hematology that it was reactive due to Depo-Provera hormonal treatment and endometriosis. She is on zoloft 75mg  QD.   At last visit, patient was asked to come in for repeat lab work in next few months, and future lab work entered but was not done; so last labs were over 1 year prior.   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 Her employer made an offer to her she couldn't refuse. She's been off of her hormonal shots for over a year (since April 2017), hoping that her CBD oil would be better. However, her periods have restarted in April 2018. She's been having a lot of pelvic pain and down the front of her left thigh. She was seen by her OBGYN, Kathryne Eriksson with [facility], for pap smear and ultrasound. Patient informed them that  her pain has become unbearable and was instructed to take ibuprofen 800mg  BID. Her period was early 1 week in July. She is followed by chiropractor, Dr. Donovan Kail, for neck down to low back. She denies indigestion or heartburn. Her mother went through menopause in her late 92s, and still has hot flashes at 53 years old.   Her BP has been running well, but only checks about once a month. Her systolic has been running in the 120s whenever she checks. She hasn't been taking Vitamin D recently. She's decreased exercise since returning to work. She also mentions gaining about 5 lbs after returning to work due to temptations of Cheetos. She hasn't been taking thyroid supplements recently; her energy levels have been fairly normal. Her bowels have been normal.   Past Medical History:  Diagnosis Date  . Anemia   . Depression   . Dysmenorrhea   . Endometriosis   . GERD (gastroesophageal reflux disease)   . Glaucoma   . Hyperlipidemia   . Hypertension   . Shortness of breath    on exertion, ie climbing stairs  . 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 Authorizing Provider  Cholecalciferol (VITAMIN D-3 PO) Take by mouth.    [provider]  Coenzyme Q10 (COQ10) 100 MG CAPS Take 1  capsule by mouth daily. Reported on 12/26/2015    [provider]  Flaxseed, Linseed, (FLAX SEEDS PO) Take 1 tablet by mouth daily. Reported on 08/17/2015    [provider]  folic acid (FOLVITE) 1 MG tablet Take 1 mg by mouth daily. Reported on 08/17/2015    [provider]  lisinopril (PRINIVIL,ZESTRIL) 40 MG tablet Take 0.5 tablets (20 mg total) by mouth daily. 03/07/16   Shawnee Knapp, MD  medroxyPROGESTERone (DEPO-PROVERA) 150 MG/ML injection  05/18/13   [provider]  Menaquinone-7 (VITAMIN K2 PO) Take by mouth.    [provider]    Nutritional Supplements (DHEA PO) Take by mouth.    [provider]  nystatin (MYCOSTATIN/NYSTOP) 100000 UNIT/GM POWD Apply 5 g topically 2 (two) times daily. 08/23/15   Shawnee Knapp, MD  nystatin-triamcinolone ointment [facility]) Apply 1 application topically 2 (two) times daily. 08/23/15   Shawnee Knapp, MD  OVER THE COUNTER MEDICATION Pregnenolone 75 mg taking daily    [provider]  OVER THE COUNTER MEDICATION Tumeric  500 mg taking daily    [provider]  OVER THE COUNTER MEDICATION Methylation Complete taking daily    [provider]  Probiotic Product (ADVANCED PROBIOTIC 10 PO) Take by mouth.    [provider]  sertraline (ZOLOFT) 50 MG tablet Take 1.5 tablets (75 mg total) by mouth daily. 12/26/15   Shawnee Knapp, MD  Travoprost, BAK Free, (TRAVATAN) 0.004 % SOLN ophthalmic solution Place 1 drop into the left eye at bedtime.    [provider]  triamterene-hydrochlorothiazide (MAXZIDE-25) 37.5-25 MG tablet Take 1 tablet by mouth daily. **Needs labs before further refills** 12/28/16   Shawnee Knapp, MD   Allergies  Allergen Reactions  . Mobic [Meloxicam]     Patient states she "felt her heart beat" while on med   Review of Systems  Constitutional: Negative for chills, fatigue, fever and unexpected weight change.  Respiratory: Negative for cough.   Gastrointestinal: Negative for constipation, diarrhea, nausea and vomiting.  Genitourinary: Positive for menstrual problem and pelvic pain.  Skin: Negative for rash and wound.  Neurological: Negative for dizziness, weakness and headaches.       Objective:   Physical Exam  Constitutional: She is oriented to person, place, and time. She appears well-developed and well-nourished. No distress.  HENT:  Head: Normocephalic and atraumatic.  Eyes: Pupils are equal, round, and reactive to light. EOM are normal.  Neck: Neck supple. No thyromegaly present.  Cardiovascular: Normal rate, regular  rhythm, S1 normal, S2 normal and normal heart sounds.  Exam reveals no gallop and no friction rub.   No murmur heard. Pulmonary/Chest: Effort normal and breath sounds normal. No respiratory distress.  Abdominal: Bowel sounds are normal. She exhibits no mass. There is tenderness in the suprapubic area.  Very tender over suprapubic and bilateral adnexa, no masses  Musculoskeletal: Normal range of motion.  No tenderness over lumbar spine or paraspinals, does have tenderness over SI joints L>R, no tenderness over trochanter bursa; negative straight leg raise, normal hip ROM bilaterally, hip flexors 5/5  Lymphadenopathy:    She has no cervical adenopathy.  Neurological: She is alert and oriented to person, place, and time.  Reflex Scores:      Patellar reflexes are 2+ on the right side and 2+ on the left side.      Achilles reflexes are 2+ on the right side and 2+ on the left side. Skin: Skin is warm  and dry.  Psychiatric: She has a normal mood and affect. Her behavior is normal.  Nursing note and vitals reviewed.   BP 121/77   Pulse 80   Temp 99.1 F (37.3 C) (Oral)   Resp 16   Ht 5\' 4"  (1.626 m)   Wt 205 lb 6.4 oz (93.2 kg)   SpO2 97%   BMI 35.26 kg/m      Assessment & Plan:   1. Essential hypertension   2. Hypercholesterolemia   3. Vitamin D deficiency   4. Metabolic syndrome   5. Leukocytosis, unspecified type   6. Endometriosis   7. Polycythemia   8. Obesity (BMI 30.0-34.9)   9. Mild episode of recurrent major depressive disorder ([place])   10. Medication monitoring encounter   11. Abdominal pain, left lower quadrant   12. Fatigue, unspecified type   13. SI (sacroiliac) pain     Orders Placed This Encounter  Procedures  . Lipid panel    Order Specific Question:   Has the patient fasted?    Answer:   Yes  . CBC with Differential/Platelet  . Comprehensive metabolic panel    Order Specific Question:   Has the patient fasted?    Answer:   Yes  . VITAMIN D 25 Hydroxy  (Vit-D Deficiency, Fractures)  . Hemoglobin A1c  . Thyroid Panel With TSH  . Hormone Panel  . Care order/instruction:    AVS printed - let patient go!    Meds ordered this encounter  Medications  . lisinopril (PRINIVIL,ZESTRIL) 20 MG tablet    Sig: Take 1 tablet (20 mg total) by mouth daily.    Dispense:  90 tablet    Refill:  3  . triamterene-hydrochlorothiazide (MAXZIDE-25) 37.5-25 MG tablet    Sig: Take 1 tablet by mouth daily.    Dispense:  90 tablet    Refill:  3  . sertraline (ZOLOFT) 50 MG tablet    Sig: Take 1.5 tablets (75 mg total) by mouth daily.    Dispense:  135 tablet    Refill:  3    I personally performed the services described in this documentation, which was scribed in my presence. The recorded information has been reviewed and considered, and addended by me as needed.   Delman Cheadle, M.D.  Primary Care at [facility] [address] [place], [place] [address] [phone] phone [phone] fax  01/13/17 8:32 AM

## 2017-01-20 NOTE — Progress Notes (Signed)
Labs sent 01/20/17 7:47am

## 2017-01-23 LAB — CBC WITH DIFFERENTIAL/PLATELET
Basophils Absolute: 0 10*3/uL (ref 0.0–0.2)
Basos: 0 %
EOS (ABSOLUTE): 0.2 10*3/uL (ref 0.0–0.4)
EOS: 2 %
Hematocrit: 48.8 % — ABNORMAL HIGH (ref 34.0–46.6)
Hemoglobin: 16.4 g/dL — ABNORMAL HIGH (ref 11.1–15.9)
IMMATURE GRANULOCYTES: 1 %
Immature Grans (Abs): 0.1 10*3/uL (ref 0.0–0.1)
LYMPHS: 23 %
Lymphocytes Absolute: 2.6 10*3/uL (ref 0.7–3.1)
MCH: 28.9 pg (ref 26.6–33.0)
MCHC: 33.6 g/dL (ref 31.5–35.7)
MCV: 86 fL (ref 79–97)
MONOS ABS: 0.6 10*3/uL (ref 0.1–0.9)
Monocytes: 5 %
NEUTROS PCT: 69 %
Neutrophils Absolute: 7.8 10*3/uL — ABNORMAL HIGH (ref 1.4–7.0)
PLATELETS: 324 10*3/uL (ref 150–379)
RBC: 5.67 x10E6/uL — AB (ref 3.77–5.28)
RDW: 14.1 % (ref 12.3–15.4)
WBC: 11.3 10*3/uL — AB (ref 3.4–10.8)

## 2017-01-23 LAB — COMPREHENSIVE METABOLIC PANEL
ALT: 17 IU/L (ref 0–32)
AST: 17 IU/L (ref 0–40)
Albumin/Globulin Ratio: 2.1 (ref 1.2–2.2)
Albumin: 4.6 g/dL (ref 3.5–5.5)
Alkaline Phosphatase: 63 IU/L (ref 39–117)
BUN/Creatinine Ratio: 18 (ref 9–23)
BUN: 14 mg/dL (ref 6–24)
Bilirubin Total: 0.4 mg/dL (ref 0.0–1.2)
CALCIUM: 9.4 mg/dL (ref 8.7–10.2)
CO2: 22 mmol/L (ref 20–29)
CREATININE: 0.79 mg/dL (ref 0.57–1.00)
Chloride: 98 mmol/L (ref 96–106)
GFR calc Af Amer: 99 mL/min/{1.73_m2} (ref >59)
GFR, EST NON AFRICAN AMERICAN: 86 mL/min/{1.73_m2} (ref >59)
Globulin, Total: 2.2 g/dL (ref 1.5–4.5)
Glucose: 92 mg/dL (ref 65–99)
POTASSIUM: 4.1 mmol/L (ref 3.5–5.2)
Sodium: 139 mmol/L (ref 134–144)
Total Protein: 6.8 g/dL (ref 6.0–8.5)

## 2017-01-23 LAB — HORMONE PANEL (T4,TSH,FSH,TESTT,SHBG,DHEA,ETC)
DHEA-Sulfate, LCMS: 35 ug/dL
ESTRADIOL: 252 pg/mL
ESTRONE: 158 ng/dL
FREE T-3: 2.3 pg/mL
FSH: 13 m[IU]/mL
Free Testosterone, Serum: 2.4 pg/mL
Progesterone, Serum: 152 ng/dL
Sex Hormone Binding Globulin: 38.6 nmol/L
T4: 6.1 ug/dL
TESTOSTERONE, SERUM (TOTAL): 15 ng/dL
TRIIODOTHYRONINE (T-3), SERUM: 96 ng/dL
TSH-ICMA: 2.9 uU/mL
Testosterone-% Free: 1.6 % — ABNORMAL HIGH

## 2017-01-23 LAB — LIPID PANEL
Chol/HDL Ratio: 6 ratio — ABNORMAL HIGH (ref 0.0–4.4)
Cholesterol, Total: 266 mg/dL — ABNORMAL HIGH (ref 100–199)
HDL: 44 mg/dL (ref >39)
LDL CALC: 164 mg/dL — AB (ref 0–99)
Triglycerides: 290 mg/dL — ABNORMAL HIGH (ref 0–149)
VLDL CHOLESTEROL CAL: 58 mg/dL — AB (ref 5–40)

## 2017-01-23 LAB — THYROID PANEL WITH TSH
Free Thyroxine Index: 1.5 (ref 1.2–4.9)
T3 Uptake Ratio: 24 % (ref 24–39)
T4 TOTAL: 6.3 ug/dL (ref 4.5–12.0)
TSH: 2.63 u[IU]/mL (ref 0.450–4.500)

## 2017-01-23 LAB — VITAMIN D 25 HYDROXY (VIT D DEFICIENCY, FRACTURES): VIT D 25 HYDROXY: 19.1 ng/mL — AB (ref 30.0–100.0)

## 2017-01-23 LAB — HEMOGLOBIN A1C
ESTIMATED AVERAGE GLUCOSE: 114 mg/dL
Hgb A1c MFr Bld: 5.6 % (ref 4.8–5.6)

## 2017-02-03 DIAGNOSIS — R5382 Chronic fatigue, unspecified: Secondary | ICD-10-CM | POA: Diagnosis not present

## 2017-02-03 DIAGNOSIS — N926 Irregular menstruation, unspecified: Secondary | ICD-10-CM | POA: Diagnosis not present

## 2017-02-03 DIAGNOSIS — I10 Essential (primary) hypertension: Secondary | ICD-10-CM | POA: Diagnosis not present

## 2017-02-03 DIAGNOSIS — N809 Endometriosis, unspecified: Secondary | ICD-10-CM | POA: Diagnosis not present

## 2017-02-06 ENCOUNTER — Encounter: Payer: Self-pay | Admitting: Family Medicine

## 2017-02-13 DIAGNOSIS — R102 Pelvic and perineal pain: Secondary | ICD-10-CM | POA: Diagnosis not present

## 2017-02-13 DIAGNOSIS — N809 Endometriosis, unspecified: Secondary | ICD-10-CM | POA: Diagnosis not present

## 2017-02-14 DIAGNOSIS — Z3042 Encounter for surveillance of injectable contraceptive: Secondary | ICD-10-CM | POA: Diagnosis not present

## 2017-02-27 DIAGNOSIS — N926 Irregular menstruation, unspecified: Secondary | ICD-10-CM | POA: Diagnosis not present

## 2017-02-27 DIAGNOSIS — R5382 Chronic fatigue, unspecified: Secondary | ICD-10-CM | POA: Diagnosis not present

## 2017-02-27 DIAGNOSIS — N809 Endometriosis, unspecified: Secondary | ICD-10-CM | POA: Diagnosis not present

## 2017-05-14 DIAGNOSIS — Z3042 Encounter for surveillance of injectable contraceptive: Secondary | ICD-10-CM | POA: Diagnosis not present

## 2017-05-14 DIAGNOSIS — R102 Pelvic and perineal pain: Secondary | ICD-10-CM | POA: Diagnosis not present

## 2017-08-04 DIAGNOSIS — Z3042 Encounter for surveillance of injectable contraceptive: Secondary | ICD-10-CM | POA: Diagnosis not present

## 2017-09-19 ENCOUNTER — Telehealth: Payer: Self-pay | Admitting: Family Medicine

## 2017-09-19 NOTE — Telephone Encounter (Signed)
Patient brought in forms to be completed by Dr Brigitte Pulse about her CPE. I have placed the forms in Dr Raul Del box on 09/19/17 please call the patient to come pick the forms up once they are complete.  thank you

## 2017-09-22 NOTE — Telephone Encounter (Signed)
Pt has not been seen since 12/2016 if she needs forms completed that she dropped off she will need to make an apt for a CPE with [facility]--it has been to [place] since she was seen to have forms completed.  I will send the pt a mychart messaging explaining this and have her to call us to make an apt

## 2017-09-22 NOTE — Telephone Encounter (Signed)
Talked to pt and she would like to know why some or one of her past visits are not considered a CPE? She would like to try and use one of those visits if she can - if not she said she will schedule an appt for a CPE.   I have NOT gotten the forms out of the [facility]  Thanks!

## 2017-09-22 NOTE — Telephone Encounter (Signed)
Pt has not had a CPE/wellness exam here since 08/27/2012- that was a problem-based visit to review her HTN, HLD, etc.   Please have pt sched a CPE.  Form returned to nurse inbox at 102 w/o sig.

## 2017-10-23 DIAGNOSIS — Z01419 Encounter for gynecological examination (general) (routine) without abnormal findings: Secondary | ICD-10-CM | POA: Diagnosis not present

## 2017-10-23 DIAGNOSIS — Z1231 Encounter for screening mammogram for malignant neoplasm of breast: Secondary | ICD-10-CM | POA: Diagnosis not present

## 2017-10-23 DIAGNOSIS — Z3A35 35 weeks gestation of pregnancy: Secondary | ICD-10-CM | POA: Diagnosis not present

## 2017-10-23 DIAGNOSIS — Z3046 Encounter for surveillance of implantable subdermal contraceptive: Secondary | ICD-10-CM | POA: Diagnosis not present

## 2017-11-06 ENCOUNTER — Ambulatory Visit (INDEPENDENT_AMBULATORY_CARE_PROVIDER_SITE_OTHER): Payer: BLUE CROSS/BLUE SHIELD | Admitting: Family Medicine

## 2017-11-06 ENCOUNTER — Other Ambulatory Visit: Payer: Self-pay

## 2017-11-06 ENCOUNTER — Encounter: Payer: Self-pay | Admitting: Family Medicine

## 2017-11-06 VITALS — BP 128/72 | HR 98 | Temp 99.4°F | Resp 18 | Ht 64.0 in | Wt 201.0 lb

## 2017-11-06 DIAGNOSIS — E7800 Pure hypercholesterolemia, unspecified: Secondary | ICD-10-CM | POA: Diagnosis not present

## 2017-11-06 DIAGNOSIS — F39 Unspecified mood [affective] disorder: Secondary | ICD-10-CM

## 2017-11-06 DIAGNOSIS — E559 Vitamin D deficiency, unspecified: Secondary | ICD-10-CM

## 2017-11-06 DIAGNOSIS — Z1212 Encounter for screening for malignant neoplasm of rectum: Secondary | ICD-10-CM

## 2017-11-06 DIAGNOSIS — Z1231 Encounter for screening mammogram for malignant neoplasm of breast: Secondary | ICD-10-CM | POA: Diagnosis not present

## 2017-11-06 DIAGNOSIS — E8881 Metabolic syndrome: Secondary | ICD-10-CM

## 2017-11-06 DIAGNOSIS — D751 Secondary polycythemia: Secondary | ICD-10-CM | POA: Diagnosis not present

## 2017-11-06 DIAGNOSIS — Z0000 Encounter for general adult medical examination without abnormal findings: Secondary | ICD-10-CM

## 2017-11-06 DIAGNOSIS — Z1159 Encounter for screening for other viral diseases: Secondary | ICD-10-CM

## 2017-11-06 DIAGNOSIS — D72829 Elevated white blood cell count, unspecified: Secondary | ICD-10-CM | POA: Diagnosis not present

## 2017-11-06 DIAGNOSIS — Z1389 Encounter for screening for other disorder: Secondary | ICD-10-CM

## 2017-11-06 DIAGNOSIS — I10 Essential (primary) hypertension: Secondary | ICD-10-CM | POA: Diagnosis not present

## 2017-11-06 DIAGNOSIS — Z136 Encounter for screening for cardiovascular disorders: Secondary | ICD-10-CM

## 2017-11-06 DIAGNOSIS — Z23 Encounter for immunization: Secondary | ICD-10-CM | POA: Diagnosis not present

## 2017-11-06 DIAGNOSIS — E669 Obesity, unspecified: Secondary | ICD-10-CM | POA: Diagnosis not present

## 2017-11-06 DIAGNOSIS — Z1211 Encounter for screening for malignant neoplasm of colon: Secondary | ICD-10-CM

## 2017-11-06 DIAGNOSIS — Z113 Encounter for screening for infections with a predominantly sexual mode of transmission: Secondary | ICD-10-CM

## 2017-11-06 DIAGNOSIS — E66811 Obesity, class 1: Secondary | ICD-10-CM

## 2017-11-06 DIAGNOSIS — Z1383 Encounter for screening for respiratory disorder NEC: Secondary | ICD-10-CM

## 2017-11-06 DIAGNOSIS — Z114 Encounter for screening for human immunodeficiency virus [HIV]: Secondary | ICD-10-CM

## 2017-11-06 LAB — POCT URINALYSIS DIP (MANUAL ENTRY)
BILIRUBIN UA: NEGATIVE mg/dL
Bilirubin, UA: NEGATIVE
Glucose, UA: NEGATIVE mg/dL
Nitrite, UA: NEGATIVE
PH UA: 7 (ref 5.0–8.0)
PROTEIN UA: NEGATIVE mg/dL
RBC UA: NEGATIVE
SPEC GRAV UA: 1.015 (ref 1.010–1.025)
Urobilinogen, UA: 0.2 E.U./dL

## 2017-11-06 MED ORDER — LISINOPRIL 20 MG PO TABS: 20.0000 mg | ORAL_TABLET | Freq: Every day | ORAL | 3 refills | Status: DC

## 2017-11-06 MED ORDER — SERTRALINE HCL 50 MG PO TABS: 75.0000 mg | ORAL_TABLET | Freq: Every day | ORAL | 3 refills | Status: DC

## 2017-11-06 MED ORDER — TRIAMTERENE-HCTZ 37.5-25 MG PO TABS: 1.0000 | ORAL_TABLET | Freq: Every day | ORAL | 3 refills | Status: DC

## 2017-11-06 NOTE — Patient Instructions (Addendum)
IF you received an x-ray today, you will receive an invoice from [facility]. Please contact [facility] at [phone] with questions or concerns regarding your invoice.   IF you received labwork today, you will receive an invoice from [facility]. Please contact [facility] at [phone] with questions or concerns regarding your invoice.   Our billing staff will not be able to assist you with questions regarding bills from these companies.  You will be contacted with the lab results as soon as they are available. The fastest way to get your results is to activate your My Chart account. Instructions are located on the last page of this paperwork. If you have not heard from [place] regarding the results in 2 weeks, please contact this office.    Keeping You Healthy  Get These Tests  Blood Pressure- Have your blood pressure checked by your healthcare provider at least once a year.  Normal blood pressure is 120/80.  Weight- Have your body mass index (BMI) calculated to screen for obesity.  BMI is a measure of body fat based on height and weight.  You can calculate your own BMI at GravelBags.it  Cholesterol- Have your cholesterol checked every year.  Diabetes- Have your blood sugar checked every year if you have high blood pressure, high cholesterol, a family history of diabetes or if you are overweight.  Pap Test - Have a pap test every 1 to 5 years if you have been sexually active.  If you are older than 65 and recent pap tests have been normal you may not need additional pap tests.  In addition, if you have had a hysterectomy  for benign disease additional pap tests are not necessary.  Mammogram-Yearly mammograms are essential for early detection of breast cancer  Screening for Colon Cancer- Colonoscopy starting at age 50. Screening may begin sooner depending on your family history and other health conditions.  Follow up colonoscopy as directed by your  Gastroenterologist.  Screening for Osteoporosis- Screening begins at age 27 with bone density scanning, sooner if you are at higher risk for developing Osteoporosis.  Get these medicines  Calcium with Vitamin D- Your body requires 1200-1500 mg of Calcium a day and [phone] IU of Vitamin D a day.  You can only absorb 500 mg of Calcium at a time therefore Calcium must be taken in 2 or 3 separate doses throughout the day.  Hormones- Hormone therapy has been associated with increased risk for certain cancers and heart disease.  Talk to your healthcare provider about if you need relief from menopausal symptoms.  Aspirin- Ask your healthcare provider about taking Aspirin to prevent Heart Disease and Stroke.  Get these Immuniztions  Flu shot- Every fall  Pneumonia shot- Once after the age of 31; if you are younger ask your healthcare provider if you need a pneumonia shot.  Tetanus- Every ten years.  Zostavax- Once after the age of 54 to prevent shingles.  Take these steps  Don't smoke- Your healthcare provider can help you quit. For tips on how to quit, ask your healthcare provider or go to www.smokefree.gov or call 1-800 QUIT-NOW.  Be physically active- Exercise 5 days a week for a minimum of 30 minutes.  If you are not already physically active, start slow and gradually work up to 30 minutes of moderate physical activity.  Try walking, dancing, bike riding, swimming, etc.  Eat a healthy diet- Eat a variety of healthy foods such as fruits, vegetables, whole grains, low fat  milk, low fat cheeses, yogurt, lean meats, chicken, fish, eggs, dried beans, tofu, etc.  For more information go to www.thenutritionsource.org  Dental visit- Brush and floss teeth twice daily; visit your dentist twice a year.  Eye exam- Visit your Optometrist or Ophthalmologist yearly.  Drink alcohol in moderation- Limit alcohol intake to one drink or less a day.  Never drink and drive.  Depression- Your emotional  health is as important as your physical health.  If you're feeling down or losing interest in things you normally enjoy, please talk to your healthcare provider.  Seat Belts- can save your life; always wear one  Smoke/Carbon Monoxide detectors- These detectors need to be installed on the appropriate level of your home.  Replace batteries at least once a year.  Violence- If anyone is threatening or hurting you, please tell your healthcare provider.  Living Will/ Health care power of attorney- Discuss with your healthcare provider and family.   Bone Health Bones protect organs, store calcium, and anchor muscles. Good health habits, such as eating nutritious foods and exercising regularly, are important for maintaining healthy bones. They can also help to prevent a condition that causes bones to lose density and become weak and brittle (osteoporosis). Why is bone mass important? Bone mass refers to the amount of bone tissue that you have. The higher your bone mass, the stronger your bones. An important step toward having healthy bones throughout life is to have strong and dense bones during childhood. A young adult who has a high bone mass is more likely to have a high bone mass later in life. Bone mass at its greatest it is called peak bone mass. A large decline in bone mass occurs in older adults. In women, it occurs about the time of menopause. During this time, it is important to practice good health habits, because if more bone is lost than what is replaced, the bones will become less healthy and more likely to break (fracture). If you find that you have a low bone mass, you may be able to prevent osteoporosis or further bone loss by changing your diet and lifestyle. How can I find out if my bone mass is low? Bone mass can be measured with an X-ray test that is called a bone mineral density (BMD) test. This test is recommended for all women who are age 91 or older. It may also be recommended for  men who are age 49 or older, or for people who are more likely to develop osteoporosis due to:  Having bones that break easily.  Having a Robicheaux-term disease that weakens bones, such as kidney disease or rheumatoid arthritis.  Having menopause earlier than normal.  Taking medicine that weakens bones, such as steroids, thyroid hormones, or hormone treatment for breast cancer or prostate cancer.  Smoking.  Drinking three or more alcoholic drinks each day.  What are the nutritional recommendations for healthy bones? To have healthy bones, you need to get enough of the right minerals and vitamins. Most nutrition experts recommend getting these nutrients from the foods that you eat. Nutritional recommendations vary from person to person. Ask your health care provider what is healthy for you. Here are some general guidelines. Calcium Recommendations Calcium is the most important (essential) mineral for bone health. Most people can get enough calcium from their diet, but supplements may be recommended for people who are at risk for osteoporosis. Good sources of calcium include:  Dairy products, such as low-fat or nonfat milk, cheese, and  yogurt.  Dark green leafy vegetables, such as bok choy and broccoli.  Calcium-fortified foods, such as orange juice, cereal, bread, soy beverages, and tofu products.  Nuts, such as almonds.  Follow these recommended amounts for daily calcium intake:  Children, age 58?3: 700 mg.  Children, age 17?8: 1,000 mg.  Children, age 77?13: 1,300 mg.  Teens, age 52?18: 1,300 mg.  Adults, age 58?50: 1,000 mg.  Adults, age 28?70: ? Men: 1,000 mg. ? Women: 1,200 mg.  Adults, age 588 or older: 1,200 mg.  Pregnant and breastfeeding females: ? Teens: 1,300 mg. ? Adults: 1,000 mg.  Vitamin D Recommendations Vitamin D is the most essential vitamin for bone health. It helps the body to absorb calcium. Sunlight stimulates the skin to make vitamin D, so be sure to get  enough sunlight. If you live in a cold climate or you do not get outside often, your health care provider may recommend that you take vitamin D supplements. Good sources of vitamin D in your diet include:  Egg yolks.  Saltwater fish.  Milk and cereal fortified with vitamin D.  Follow these recommended amounts for daily vitamin D intake:  Children and teens, age 8?18: 21 international units.  Adults, age 175 or younger: 400-800 international units.  Adults, age 31 or older: 800-1,000 international units.  Other Nutrients Other nutrients for bone health include:  Phosphorus. This mineral is found in meat, poultry, dairy foods, nuts, and legumes. The recommended daily intake for adult men and adult women is 700 mg.  Magnesium. This mineral is found in seeds, nuts, dark green vegetables, and legumes. The recommended daily intake for adult men is 400?420 mg. For adult women, it is 310?320 mg.  Vitamin K. This vitamin is found in green leafy vegetables. The recommended daily intake is 120 mg for adult men and 90 mg for adult women.  What type of physical activity is best for building and maintaining healthy bones? Weight-bearing and strength-building activities are important for building and maintaining peak bone mass. Weight-bearing activities cause muscles and bones to work against gravity. Strength-building activities increases muscle strength that supports bones. Weight-bearing and muscle-building activities include:  Walking and hiking.  Jogging and running.  Dancing.  Gym exercises.  Lifting weights.  Tennis and racquetball.  Climbing stairs.  Aerobics.  Adults should get at least 30 minutes of moderate physical activity on most days. Children should get at least 60 minutes of moderate physical activity on most days. Ask your health care provide what type of exercise is best for you. Where can I find more information? For more information, check out the following  websites:  [place]: YardHomes.se  [employer] of Health: http://www.niams.AnonymousEar.fr.asp  This information is not intended to replace advice given to you by your health care provider. Make sure you discuss any questions you have with your health care provider. Document Released: 09/07/2003 Document Revised: 01/05/2016 Document Reviewed: 06/22/2014 Elsevier Interactive Patient Education  2018 [employer].   Exercising to [employer] Exercising can help you to lose weight. In order to lose weight through exercise, you need to do vigorous-intensity exercise. You can tell that you are exercising with vigorous intensity if you are breathing very hard and fast and cannot hold a conversation while exercising. Moderate-intensity exercise helps to maintain your current weight. You can tell that you are exercising at a moderate level if you have a higher heart rate and faster breathing, but you are still able to hold a conversation. How  often should I exercise? Choose an activity that you enjoy and set realistic goals. Your health care provider can help you to make an activity plan that works for you. Exercise regularly as directed by your health care provider. This may include:  Doing resistance training twice each week, such as: ? Push-ups. ? Sit-ups. ? Lifting weights. ? Using resistance bands.  Doing a given intensity of exercise for a given amount of time. Choose from these options: ? 150 minutes of moderate-intensity exercise every week. ? 75 minutes of vigorous-intensity exercise every week. ? A mix of moderate-intensity and vigorous-intensity exercise every week.  Children, pregnant women, people who are out of shape, people who are overweight, and older adults may need to consult a health care provider for individual recommendations. If you have any sort of medical condition, be sure to consult  your health care provider before starting a new exercise program. What are some activities that can help me to lose weight?  Walking at a rate of at least 4.5 miles an hour.  Jogging or running at a rate of 5 miles per hour.  Biking at a rate of at least 10 miles per hour.  Lap swimming.  Roller-skating or in-line skating.  Cross-country skiing.  Vigorous competitive sports, such as football, basketball, and soccer.  Jumping rope.  Aerobic dancing. How can I be more active in my day-to-day activities?  Use the stairs instead of the elevator.  Take a walk during your lunch break.  If you drive, park your car farther away from work or school.  If you take public transportation, get off one stop early and walk the rest of the way.  Make all of your phone calls while standing up and walking around.  Get up, stretch, and walk around every 30 minutes throughout the day. What guidelines should I follow while exercising?  Do not exercise so much that you hurt yourself, feel dizzy, or get very short of breath.  Consult your health care provider prior to starting a new exercise program.  Wear comfortable clothes and shoes with good support.  Drink plenty of water while you exercise to prevent dehydration or heat stroke. Body water is lost during exercise and must be replaced.  Work out until you breathe faster and your heart beats faster. This information is not intended to replace advice given to you by your health care provider. Make sure you discuss any questions you have with your health care provider. Document Released: 07/20/2010 Document Revised: 11/23/2015 Document Reviewed: 11/18/2013 Elsevier Interactive Patient Education  [employer].

## 2017-11-06 NOTE — Progress Notes (Signed)
Subjective:    Patient ID: Ariana Juarez, female    DOB: 02-04-1966, 52 y.o.   MRN: [id number] Chief Complaint  Patient presents with  . Annual Exam    No pap needed. Pt states she has pap 2 weeks ago. Pt brings in Wellness Exam form  . Medication Refill    All meds    HPI Primary Preventative Screenings: Cervical Cancer: Done 2 weeks prior - 10/23/17 at gyn Family Planning: post-menopausal STI screening: declines need but agrees to do CDC rec one time HIV/Hep C tests today Breast Cancer: mammogram done 10/23/2017 at gyn Colorectal Cancer: Done 05/09/15 - repeat in 3 years which will be in 6 mos - should be contacted by GI office Tobacco use/EtOH/substances: none Bone Density: Cardiac: EKG today as last was 5 yrs ago Weight/Blood sugar/Diet/Exercise: BMI Readings from Last 3 Encounters:  11/06/17 34.50 kg/m  01/10/17 35.26 kg/m  03/07/16 33.09 kg/m   Lab Results  Component Value Date   HGBA1C 5.6 01/10/2017   OTC/Vit/Supp/Herbal: Dentist/Optho: Immunizations:  Immunization History  Administered Date(s) Administered  . Influenza Split 03/31/2012, 03/15/2014  . Influenza,inj,Quad PF,6+ Mos 03/02/2015, 03/07/2016     Chronic Medical Conditions: Patient Active Problem List   Diagnosis Date Noted  . Obesity (BMI 30.0-34.9) 03/02/2015  . Polycythemia 09/27/2014  . Vitamin D deficiency 09/04/2014  . Metabolic syndrome 25/95/6387  . Leukocytosis 09/04/2014  . Endometriosis 09/04/2014  . Gallstones 06/02/2013  . Depression 08/27/2012  . Dysmenorrhea   . HTN (hypertension) 12/19/2011  . Hypercholesterolemia 12/19/2011    Past Medical History:  Diagnosis Date  . Anemia   . Depression   . Dysmenorrhea   . Endometriosis   . GERD (gastroesophageal reflux disease)   . Glaucoma   . Hyperlipidemia   . Hypertension   . Shortness of breath    on exertion, ie climbing stairs  . 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 DENTAL SURGERY     bone grafting  . DILATION AND CURETTAGE OF UTERUS    . LAPAROSCOPY N/A 11/12/2012   Procedure: LAPAROSCOPY DIAGNOSTIC;  Surgeon: Marylynn Pearson, MD;  Location: [facility];  Service: Gynecology;  Laterality: N/A;   Current Outpatient Medications on File Prior to Visit  Medication Sig Dispense Refill  . lisinopril (PRINIVIL,ZESTRIL) 20 MG tablet Take 1 tablet (20 mg total) by mouth daily. 90 tablet 3  . OVER THE COUNTER MEDICATION Tumeric  500 mg taking daily    . OVER THE COUNTER MEDICATION Methylation Complete taking daily    . Probiotic Product (ADVANCED PROBIOTIC 10 PO) Take by mouth.    . sertraline (ZOLOFT) 50 MG tablet Take 1.5 tablets (75 mg total) by mouth daily. 135 tablet 3  . triamterene-hydrochlorothiazide (MAXZIDE-25) 37.5-25 MG tablet Take 1 tablet by mouth daily. 90 tablet 3   No current facility-administered medications on file prior to visit.    Allergies  Allergen Reactions  . Mobic [Meloxicam]     Patient states she "felt her heart beat" while on med   Family History  Problem Relation Age of Onset  . Cancer Mother 76       uterine  . Cancer Father        lung/liver  . Spina bifida Brother        mild  . Crohn's disease Brother 55  . Colon cancer Neg Hx    Social  History   Socioeconomic History  . Marital status: Divorced    Spouse name: n/a  . Number of children: 0  . Years of education: 66  . Highest education level: Not on file  Occupational History  . Occupation: Health visitor: [employer]  Social Needs  . Financial resource strain: Not on file  . Food insecurity:    Worry: Not on file    Inability: Not on file  . Transportation needs:    Medical: Not on file    Non-medical: Not on file  Tobacco Use  . Smoking status: Never Smoker  . Smokeless tobacco: Never Used  Substance and Sexual Activity  . Alcohol use: Yes    Comment: one drink every 2 years   . Drug use: No  . Sexual activity: Not Currently    Partners: Male  Lifestyle  . Physical activity:    Days per week: Not on file    Minutes per session: Not on file  . Stress: Not on file  Relationships  . Social connections:    Talks on phone: Not on file    Gets together: Not on file    Attends religious service: Not on file    Active member of club or organization: Not on file    Attends meetings of clubs or organizations: Not on file    Relationship status: Not on file  Other Topics Concern  . Not on file  Social History Narrative   Lives with her mother and brother, and her maternal uncle.   Depression screen [facility] 2/9 11/06/2017 01/10/2017 12/26/2015 08/17/2015 06/29/2015  Decreased Interest 0 0 0 0 0  Down, Depressed, Hopeless 0 0 0 0 0  PHQ - 2 Score 0 0 0 0 0      Review of Systems  All other systems reviewed and are negative.      Objective:   Physical Exam  Constitutional: She is oriented to person, place, and time. She appears well-developed and well-nourished. No distress.  HENT:  Head: Normocephalic and atraumatic.  Right Ear: Tympanic membrane, external ear and ear canal normal.  Left Ear: Tympanic membrane, external ear and ear canal normal.  Nose: Nose normal. No mucosal edema or rhinorrhea.  Mouth/Throat: Uvula is midline, oropharynx is clear and moist and mucous membranes are normal. No posterior oropharyngeal erythema.  Eyes: Pupils are equal, round, and reactive to light. Conjunctivae and EOM are normal. Right eye exhibits no discharge. Left eye exhibits no discharge. No scleral icterus.  Neck: Normal range of motion. Neck supple. No thyromegaly present.  Cardiovascular: Normal rate, regular rhythm, normal heart sounds and intact distal pulses.  Pulmonary/Chest: Effort normal and breath sounds normal. No respiratory distress.  Abdominal: Soft. Bowel sounds are normal. There is no tenderness.  Musculoskeletal: She exhibits no edema.  Lymphadenopathy:      She has no cervical adenopathy.  Neurological: She is alert and oriented to person, place, and time. She has normal reflexes.  Skin: Skin is warm and dry. She is not diaphoretic. No erythema.  Psychiatric: She has a normal mood and affect. Her behavior is normal.         BP 128/72 (BP Location: Left Arm, Patient Position: Sitting, Cuff Size: Normal)   Pulse 98   Temp 99.4 F (37.4 C) (Oral)   Resp 18   Ht 5\' 4"  (1.626 m)   Wt 201 lb (91.2 kg)   SpO2 96%   BMI 34.50 kg/m   EKG:  NSR, no acute ischemic changes noted. No significant change noted when compared to prior EKG done 11/13/2012 other than a sig decrease in R wave amplitude in lead III and loss of Q waves in aVL. I have personally reviewed the EKG tracing and agree with the computer interpretation: Sinus  Rhythm  -RSR(V1) -nondiagnostic.   PROBABLY NORMAL Assessment & Plan:   1. Annual physical exam   2. Screening for cardiovascular, respiratory, and genitourinary diseases   3. Encounter for screening mammogram for breast cancer   4. Routine screening for STI (sexually transmitted infection)   5. Screening for colorectal cancer - will be due for repeat colonoscopy in 6 mos  6. Essential hypertension - well controlled on lisinopril 20 and maxzide 37.5-25 - cont  7. Hypercholesterolemia   8. Vitamin D deficiency   9. Metabolic syndrome   10. Leukocytosis, unspecified type   11. Polycythemia   12. Obesity (BMI 30.0-34.9)   13. Screening for HIV (human immunodeficiency virus)   14. Need for hepatitis C screening test   15.    Episodic mood disorder - cont sertraline 50 - needing since she went back to work from retirement  Orders Placed This Encounter  Procedures  . Tdap vaccine greater than or equal to 7yo IM  . Comprehensive metabolic panel    Order Specific Question:   Has the patient fasted?    Answer:   Yes  . TSH  . CBC with Differential/Platelet  . Hemoglobin A1c  . VITAMIN D 25 Hydroxy (Vit-D  Deficiency, Fractures)  . HCV Ab w/Rflx to Verification  . HIV antibody  . Interpretation:  . POCT urinalysis dipstick  . EKG 12-Lead    Meds ordered this encounter  Medications  . lisinopril (PRINIVIL,ZESTRIL) 20 MG tablet    Sig: Take 1 tablet (20 mg total) by mouth daily.    Dispense:  90 tablet    Refill:  3  . triamterene-hydrochlorothiazide (MAXZIDE-25) 37.5-25 MG tablet    Sig: Take 1 tablet by mouth daily.    Dispense:  90 tablet    Refill:  3  . sertraline (ZOLOFT) 50 MG tablet    Sig: Take 1.5 tablets (75 mg total) by mouth daily.    Dispense:  135 tablet    Refill:  3    I personally performed the services described in this documentation, which was scribed in my presence. The recorded information has been reviewed and considered, and addended by me as needed.   Delman Cheadle, M.D.  Primary Care at [facility] [address] [place], [place] [address] [phone] phone [phone] fax  12/13/17 1:16 PM

## 2017-11-07 LAB — COMPREHENSIVE METABOLIC PANEL
A/G RATIO: 2.1 (ref 1.2–2.2)
ALT: 18 IU/L (ref 0–32)
AST: 14 IU/L (ref 0–40)
Albumin: 4.9 g/dL (ref 3.5–5.5)
Alkaline Phosphatase: 63 IU/L (ref 39–117)
BUN/Creatinine Ratio: 10 (ref 9–23)
BUN: 9 mg/dL (ref 6–24)
Bilirubin Total: <0.2 mg/dL (ref 0.0–1.2)
CO2: 22 mmol/L (ref 20–29)
CREATININE: 0.87 mg/dL (ref 0.57–1.00)
Calcium: 9.7 mg/dL (ref 8.7–10.2)
Chloride: 101 mmol/L (ref 96–106)
GFR calc non Af Amer: 76 mL/min/{1.73_m2} (ref >59)
GFR, EST AFRICAN AMERICAN: 88 mL/min/{1.73_m2} (ref >59)
GLUCOSE: 96 mg/dL (ref 65–99)
Globulin, Total: 2.3 g/dL (ref 1.5–4.5)
Potassium: 4.1 mmol/L (ref 3.5–5.2)
Sodium: 141 mmol/L (ref 134–144)
TOTAL PROTEIN: 7.2 g/dL (ref 6.0–8.5)

## 2017-11-07 LAB — CBC WITH DIFFERENTIAL/PLATELET
BASOS: 0 %
Basophils Absolute: 0.1 10*3/uL (ref 0.0–0.2)
EOS (ABSOLUTE): 0.2 10*3/uL (ref 0.0–0.4)
EOS: 1 %
HEMATOCRIT: 44.5 % (ref 34.0–46.6)
HEMOGLOBIN: 15.1 g/dL (ref 11.1–15.9)
IMMATURE GRANULOCYTES: 0 %
Immature Grans (Abs): 0 10*3/uL (ref 0.0–0.1)
LYMPHS ABS: 4.4 10*3/uL — AB (ref 0.7–3.1)
Lymphs: 28 %
MCH: 28.3 pg (ref 26.6–33.0)
MCHC: 33.9 g/dL (ref 31.5–35.7)
MCV: 83 fL (ref 79–97)
MONOCYTES: 6 %
MONOS ABS: 1 10*3/uL — AB (ref 0.1–0.9)
Neutrophils Absolute: 9.9 10*3/uL — ABNORMAL HIGH (ref 1.4–7.0)
Neutrophils: 65 %
Platelets: 371 10*3/uL (ref 150–379)
RBC: 5.34 x10E6/uL — AB (ref 3.77–5.28)
RDW: 14.1 % (ref 12.3–15.4)
WBC: 15.7 10*3/uL — ABNORMAL HIGH (ref 3.4–10.8)

## 2017-11-07 LAB — HEMOGLOBIN A1C
ESTIMATED AVERAGE GLUCOSE: 117 mg/dL
Hgb A1c MFr Bld: 5.7 % — ABNORMAL HIGH (ref 4.8–5.6)

## 2017-11-07 LAB — TSH: TSH: 2.87 u[IU]/mL (ref 0.450–4.500)

## 2017-11-07 LAB — VITAMIN D 25 HYDROXY (VIT D DEFICIENCY, FRACTURES): VIT D 25 HYDROXY: 47.1 ng/mL (ref 30.0–100.0)

## 2017-11-07 LAB — HCV AB W/RFLX TO VERIFICATION: HCV Ab: <0.1 s/co ratio (ref 0.0–0.9)

## 2017-11-07 LAB — HIV ANTIBODY (ROUTINE TESTING W REFLEX): HIV SCREEN 4TH GENERATION: NONREACTIVE

## 2017-11-07 LAB — HCV INTERPRETATION

## 2017-11-20 ENCOUNTER — Encounter: Payer: Self-pay | Admitting: Family Medicine

## 2018-01-16 ENCOUNTER — Other Ambulatory Visit: Payer: Self-pay | Admitting: Family Medicine

## 2018-06-21 ENCOUNTER — Encounter: Payer: Self-pay | Admitting: Gastroenterology

## 2018-08-31 ENCOUNTER — Telehealth: Payer: Self-pay | Admitting: Family Medicine

## 2018-08-31 NOTE — Telephone Encounter (Signed)
mychart message sent to pt about their appointment with Dr Shaw °

## 2018-09-22 ENCOUNTER — Ambulatory Visit: Payer: BLUE CROSS/BLUE SHIELD | Admitting: Family Medicine

## 2018-11-09 ENCOUNTER — Telehealth (INDEPENDENT_AMBULATORY_CARE_PROVIDER_SITE_OTHER): Payer: BLUE CROSS/BLUE SHIELD | Admitting: Family Medicine

## 2018-11-09 ENCOUNTER — Other Ambulatory Visit: Payer: Self-pay

## 2018-11-09 DIAGNOSIS — E559 Vitamin D deficiency, unspecified: Secondary | ICD-10-CM | POA: Diagnosis not present

## 2018-11-09 DIAGNOSIS — E7800 Pure hypercholesterolemia, unspecified: Secondary | ICD-10-CM

## 2018-11-09 DIAGNOSIS — R739 Hyperglycemia, unspecified: Secondary | ICD-10-CM

## 2018-11-09 DIAGNOSIS — F3341 Major depressive disorder, recurrent, in partial remission: Secondary | ICD-10-CM

## 2018-11-09 DIAGNOSIS — I10 Essential (primary) hypertension: Secondary | ICD-10-CM | POA: Diagnosis not present

## 2018-11-09 DIAGNOSIS — D72829 Elevated white blood cell count, unspecified: Secondary | ICD-10-CM | POA: Diagnosis not present

## 2018-11-09 MED ORDER — TRIAMTERENE-HCTZ 37.5-25 MG PO TABS: 1.0000 | ORAL_TABLET | Freq: Every day | ORAL | 1 refills | Status: DC

## 2018-11-09 NOTE — Progress Notes (Signed)
Virtual Visit Note  I connected with patient on 11/09/18 at 230pm by phone and verified that I am speaking with the correct person using two identifiers. Ariana Juarez is currently located at home and patient is currently with them during visit. The provider, Rutherford Guys, MD is located in their office at time of visit.  I discussed the limitations, risks, security and privacy concerns of performing an evaluation and management service by telephone and the availability of in person appointments. I also discussed with the patient that there may be a patient responsible charge related to this service. The patient expressed understanding and agreed to proceed.   CC: medication refill  HPI ? 53 yo F with PMH: HTN, HLP, prediabetes, vitamin D deficiency, leukocytosis, colonic polyps, depression who presents today requesting refill of medications  Previous PCP Dr Brigitte Pulse Last OV May 2019  Patient reports that she has been overall doing well Has no acute concerns today She does not check BP at home She has been trying to eat healthier She reports weight has been stable She does wear fit bit Had increase in depression and anxiety at beginning of covid 19 but now normal Takes daily vitamin D and calcium supplement  She reports that workup for leukocytosis as costly Did not return to discuss results   Sees Gyn, Dr Kathryne Eriksson, at [facility] Pap with neg HPV Feb 2017 Does mammogram thru gyn  Colonoscopy nov 2016, tubular adenoma x 1  Allergies  Allergen Reactions  . Mobic [Meloxicam]     Patient states she "felt her heart beat" while on med    Prior to Admission medications   Medication Sig Start Date End Date Taking? Authorizing Provider  lisinopril (PRINIVIL,ZESTRIL) 20 MG tablet Take 1 tablet (20 mg total) by mouth daily. 11/06/17   Shawnee Knapp, MD  OVER THE COUNTER MEDICATION Tumeric  500 mg taking daily    [provider]  OVER THE COUNTER MEDICATION  Methylation Complete taking daily    [provider]  Probiotic Product (ADVANCED PROBIOTIC 10 PO) Take by mouth.    [provider]  sertraline (ZOLOFT) 50 MG tablet Take 1.5 tablets (75 mg total) by mouth daily. 11/06/17   Shawnee Knapp, MD  triamterene-hydrochlorothiazide (MAXZIDE-25) 37.5-25 MG tablet Take 1 tablet by mouth daily. 11/06/17   Shawnee Knapp, MD    Past Medical History:  Diagnosis Date  . Anemia   . Depression   . Dysmenorrhea   . Endometriosis   . GERD (gastroesophageal reflux disease)   . Glaucoma   . Hyperlipidemia   . Hypertension   . Shortness of breath    on exertion, ie climbing stairs  . 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 DENTAL SURGERY     bone grafting  . DILATION AND CURETTAGE OF UTERUS    . LAPAROSCOPY N/A 11/12/2012   Procedure: LAPAROSCOPY DIAGNOSTIC;  Surgeon: Marylynn Pearson, MD;  Location: [facility];  Service: Gynecology;  Laterality: N/A;    Social History   Tobacco Use  . Smoking status: Never Smoker  . Smokeless tobacco: Never Used  Substance Use Topics  . Alcohol use: Yes    Comment: one drink every 2 years    Family History  Problem Relation Age of Onset  . Cancer Mother 68       uterine  .  Cancer Father        lung/liver  . Spina bifida Brother        mild  . Crohn's disease Brother 84  . Colon cancer Neg Hx     ROS Per hpi  Objective  Vitals as reported by the patient: n one   ASSESSMENT and PLAN  1. Essential hypertension Refilling meds. BP check with lab visit. - Comprehensive metabolic panel; Future  2. Hypercholesterolemia Patient has been working on LFM. Labs ordered per below. - Comprehensive metabolic panel; Future - Lipid panel; Future - TSH; Future  3. Vitamin D deficiency On daily supplement. Will be adjusted as needed. - VITAMIN D 25 Hydroxy (Vit-D Deficiency,  Fractures); Future  4. Leukocytosis, unspecified type Chronic, preliminary workup by heme negative. Smear shows neutrophilia. Consider referring back to heme. - CBC with Differential/Platelet; Future  5. Hyperglycemia Patient has been working on LFM. Labs ordered per below. - Hemoglobin A1c; Future  6. Recurrent major depressive disorder, in partial remission ([place]) Controlled. Continue current medication.  Other orders - triamterene-hydrochlorothiazide (MAXZIDE-25) 37.5-25 MG tablet; Take 1 tablet by mouth daily.  FOLLOW-UP: FASTING labs with BP check within 1 week. Followup with me in 3 months.    The above assessment and management plan was discussed with the patient. The patient verbalized understanding of and has agreed to the management plan. Patient is aware to call the clinic if symptoms persist or worsen. Patient is aware when to return to the clinic for a follow-up visit. Patient educated on when it is appropriate to go to the emergency department.    I provided 15 minutes of non-face-to-face time during this encounter.  Rutherford Guys, MD Primary Care at [place], [place] [address] Ph.  [phone] Fax [phone]

## 2019-01-04 ENCOUNTER — Ambulatory Visit (INDEPENDENT_AMBULATORY_CARE_PROVIDER_SITE_OTHER): Payer: BC Managed Care – PPO | Admitting: Family Medicine

## 2019-01-04 ENCOUNTER — Other Ambulatory Visit: Payer: Self-pay

## 2019-01-04 DIAGNOSIS — D72829 Elevated white blood cell count, unspecified: Secondary | ICD-10-CM

## 2019-01-04 DIAGNOSIS — E559 Vitamin D deficiency, unspecified: Secondary | ICD-10-CM

## 2019-01-04 DIAGNOSIS — I10 Essential (primary) hypertension: Secondary | ICD-10-CM

## 2019-01-04 DIAGNOSIS — R739 Hyperglycemia, unspecified: Secondary | ICD-10-CM | POA: Diagnosis not present

## 2019-01-04 DIAGNOSIS — E7800 Pure hypercholesterolemia, unspecified: Secondary | ICD-10-CM

## 2019-01-05 LAB — CBC WITH DIFFERENTIAL/PLATELET
Basophils Absolute: 0.1 10*3/uL (ref 0.0–0.2)
Basos: 1 %
EOS (ABSOLUTE): 0.2 10*3/uL (ref 0.0–0.4)
Eos: 2 %
Hematocrit: 46.6 % (ref 34.0–46.6)
Hemoglobin: 15.3 g/dL (ref 11.1–15.9)
Immature Grans (Abs): 0.1 10*3/uL (ref 0.0–0.1)
Immature Granulocytes: 1 %
Lymphocytes Absolute: 3.1 10*3/uL (ref 0.7–3.1)
Lymphs: 29 %
MCH: 28.3 pg (ref 26.6–33.0)
MCHC: 32.8 g/dL (ref 31.5–35.7)
MCV: 86 fL (ref 79–97)
Monocytes Absolute: 0.8 10*3/uL (ref 0.1–0.9)
Monocytes: 8 %
Neutrophils Absolute: 6.7 10*3/uL (ref 1.4–7.0)
Neutrophils: 59 %
Platelets: 316 10*3/uL (ref 150–450)
RBC: 5.4 x10E6/uL — ABNORMAL HIGH (ref 3.77–5.28)
RDW: 13.2 % (ref 11.7–15.4)
WBC: 10.9 10*3/uL — ABNORMAL HIGH (ref 3.4–10.8)

## 2019-01-05 LAB — COMPREHENSIVE METABOLIC PANEL
ALT: 18 IU/L (ref 0–32)
AST: 20 IU/L (ref 0–40)
Albumin/Globulin Ratio: 2.4 — ABNORMAL HIGH (ref 1.2–2.2)
Albumin: 4.7 g/dL (ref 3.8–4.9)
Alkaline Phosphatase: 67 IU/L (ref 39–117)
BUN/Creatinine Ratio: 16 (ref 9–23)
BUN: 14 mg/dL (ref 6–24)
Bilirubin Total: 0.3 mg/dL (ref 0.0–1.2)
CO2: 24 mmol/L (ref 20–29)
Calcium: 10.2 mg/dL (ref 8.7–10.2)
Chloride: 98 mmol/L (ref 96–106)
Creatinine, Ser: 0.86 mg/dL (ref 0.57–1.00)
GFR calc Af Amer: 88 mL/min/{1.73_m2} (ref >59)
GFR calc non Af Amer: 76 mL/min/{1.73_m2} (ref >59)
Globulin, Total: 2 g/dL (ref 1.5–4.5)
Glucose: 114 mg/dL — ABNORMAL HIGH (ref 65–99)
Potassium: 4.3 mmol/L (ref 3.5–5.2)
Sodium: 140 mmol/L (ref 134–144)
Total Protein: 6.7 g/dL (ref 6.0–8.5)

## 2019-01-05 LAB — LIPID PANEL
Chol/HDL Ratio: 5.1 ratio — ABNORMAL HIGH (ref 0.0–4.4)
Cholesterol, Total: 244 mg/dL — ABNORMAL HIGH (ref 100–199)
HDL: 48 mg/dL (ref >39)
LDL Calculated: 146 mg/dL — ABNORMAL HIGH (ref 0–99)
Triglycerides: 250 mg/dL — ABNORMAL HIGH (ref 0–149)
VLDL Cholesterol Cal: 50 mg/dL — ABNORMAL HIGH (ref 5–40)

## 2019-01-05 LAB — TSH: TSH: 2.22 u[IU]/mL (ref 0.450–4.500)

## 2019-01-05 LAB — HEMOGLOBIN A1C
Est. average glucose Bld gHb Est-mCnc: 117 mg/dL
Hgb A1c MFr Bld: 5.7 % — ABNORMAL HIGH (ref 4.8–5.6)

## 2019-01-05 LAB — VITAMIN D 25 HYDROXY (VIT D DEFICIENCY, FRACTURES): Vit D, 25-Hydroxy: 51.2 ng/mL (ref 30.0–100.0)

## 2019-01-06 NOTE — Progress Notes (Signed)
Lab visit only.  Did not see provider at this visit.  

## 2019-01-21 ENCOUNTER — Other Ambulatory Visit: Payer: Self-pay | Admitting: Family Medicine

## 2019-01-21 NOTE — Telephone Encounter (Signed)
Requested medication (s) are due for refill today: yes  Requested medication (s) are on the active medication list: yes  Last refill: 11/06/17  Future visit scheduled: No  Notes to clinic:  Needs appointment.  Rx expired    Requested Prescriptions  Pending Prescriptions Disp Refills   lisinopril (ZESTRIL) 20 MG tablet [Pharmacy Med Name: LISINOPRIL 20MG  TABLETS] 90 tablet 3    Sig: TAKE 1 TABLET(20 MG) BY MOUTH DAILY     Cardiovascular:  ACE Inhibitors Failed - 01/21/2019 10:06 AM      Failed - Valid encounter within last 6 months    Recent Outpatient Visits          2 weeks ago Vitamin D deficiency   [facility], Lilia Argue, MD   1 year ago Annual physical exam   [facility], Laurey Arrow, MD   2 years ago Essential hypertension   [facility], Laurey Arrow, MD   2 years ago Essential hypertension   [facility], Laurey Arrow, MD   3 years ago Essential hypertension   [facility], Laurey Arrow, MD             Passed - Cr in normal range and within 180 days    Creatinine  Date Value Ref Range Status  09/27/2014 0.8 0.6 - 1.1 mg/dL Final   Creat  Date Value Ref Range Status  12/26/2015 0.88 0.50 - 1.05 mg/dL Final    Comment:      For patients > or = 55 years of age: The upper reference limit for Creatinine is approximately 13% higher for people identified as African-American.      Creatinine, Ser  Date Value Ref Range Status  01/04/2019 0.86 0.57 - 1.00 mg/dL Final         Passed - K in normal range and within 180 days    Potassium  Date Value Ref Range Status  01/04/2019 4.3 3.5 - 5.2 mmol/L Final  09/27/2014 3.4 (L) 3.5 - 5.1 mEq/L Final         Passed - Patient is not pregnant      Passed - Last BP in normal range    BP Readings from Last 1 Encounters:  11/06/17 128/72          sertraline (ZOLOFT) 50 MG tablet [Pharmacy Med Name: SERTRALINE 50MG  TABLETS] 135 tablet 3    Sig: TAKE 1 AND 1/2  TABLETS(75 MG) BY MOUTH DAILY     Psychiatry:  Antidepressants - SSRI Failed - 01/21/2019 10:06 AM      Failed - Valid encounter within last 6 months    Recent Outpatient Visits          2 weeks ago Vitamin D deficiency   [facility], Lilia Argue, MD   1 year ago Annual physical exam   [facility], Laurey Arrow, MD   2 years ago Essential hypertension   [facility], Laurey Arrow, MD   2 years ago Essential hypertension   [facility], Laurey Arrow, MD   3 years ago Essential hypertension   [facility], Laurey Arrow, MD             Passed - Completed PHQ-2 or PHQ-9 in the last 360 days.

## 2019-03-19 ENCOUNTER — Encounter: Payer: Self-pay | Admitting: Family Medicine

## 2019-04-21 ENCOUNTER — Other Ambulatory Visit: Payer: Self-pay | Admitting: Family Medicine

## 2019-05-06 DIAGNOSIS — Z01419 Encounter for gynecological examination (general) (routine) without abnormal findings: Secondary | ICD-10-CM | POA: Diagnosis not present

## 2019-05-06 DIAGNOSIS — Z1231 Encounter for screening mammogram for malignant neoplasm of breast: Secondary | ICD-10-CM | POA: Diagnosis not present

## 2019-05-06 DIAGNOSIS — Z6834 Body mass index (BMI) 34.0-34.9, adult: Secondary | ICD-10-CM | POA: Diagnosis not present

## 2019-05-24 ENCOUNTER — Encounter: Payer: Self-pay | Admitting: Family Medicine

## 2019-05-28 ENCOUNTER — Telehealth: Payer: Self-pay | Admitting: Family Medicine

## 2019-05-28 NOTE — Telephone Encounter (Signed)
Pt needs refill sent today/ Pt made request from pharmacy and pharmacy advised they wasn't getting response from PCP/ please send refill for triamterene-hydrochlorothiazide (MAXZIDE-25) 37.5-25 MG tablet Pt has been without medication for 4 days and wants to know if that is ok / please advise and send to [facility] [place], [place] AT [place] [phone] (Phone) [phone] (Fax)

## 2019-06-02 ENCOUNTER — Telehealth: Payer: Self-pay | Admitting: Family Medicine

## 2019-06-02 NOTE — Telephone Encounter (Signed)
Pt is calling back to get this med refilled and is wondering if she even need this med triamterene-hydrochlorothiazide (MAXZIDE-25) 37.5-25 MG tablet   [facility]  is waiting approval   FR

## 2019-06-03 MED ORDER — TRIAMTERENE-HCTZ 37.5-25 MG PO TABS: 1.0000 | ORAL_TABLET | Freq: Every day | ORAL | 1 refills | Status: DC

## 2019-06-03 NOTE — Telephone Encounter (Signed)
Refill was approved. Called pt to schedule in office bp check, med refills appt per [place]. LVMTCB

## 2019-06-03 NOTE — Telephone Encounter (Signed)
lvmtcb

## 2019-06-03 NOTE — Telephone Encounter (Signed)
Medication is for blood pressure, so yes she needs it. I sent  a refill. She needs an office visit, in person, for BP followup. thanks

## 2019-06-14 ENCOUNTER — Encounter: Payer: Self-pay | Admitting: Gastroenterology

## 2019-06-15 NOTE — Telephone Encounter (Signed)
Spoke wit pt and she is scheduled for 06/22/2019 @3 :40 for a F/U for BP. Pk/CMA

## 2019-06-22 ENCOUNTER — Other Ambulatory Visit: Payer: Self-pay

## 2019-06-22 ENCOUNTER — Encounter: Payer: Self-pay | Admitting: Family Medicine

## 2019-06-22 ENCOUNTER — Ambulatory Visit (INDEPENDENT_AMBULATORY_CARE_PROVIDER_SITE_OTHER): Payer: BC Managed Care – PPO | Admitting: Family Medicine

## 2019-06-22 VITALS — BP 130/85 | HR 82 | Temp 97.4°F | Ht 64.0 in | Wt 197.4 lb

## 2019-06-22 DIAGNOSIS — E559 Vitamin D deficiency, unspecified: Secondary | ICD-10-CM | POA: Diagnosis not present

## 2019-06-22 DIAGNOSIS — F3341 Major depressive disorder, recurrent, in partial remission: Secondary | ICD-10-CM

## 2019-06-22 DIAGNOSIS — E7800 Pure hypercholesterolemia, unspecified: Secondary | ICD-10-CM | POA: Diagnosis not present

## 2019-06-22 DIAGNOSIS — I10 Essential (primary) hypertension: Secondary | ICD-10-CM | POA: Diagnosis not present

## 2019-06-22 DIAGNOSIS — R739 Hyperglycemia, unspecified: Secondary | ICD-10-CM

## 2019-06-22 MED ORDER — LISINOPRIL 20 MG PO TABS: ORAL_TABLET | ORAL | 1 refills | Status: DC

## 2019-06-22 MED ORDER — TRIAMTERENE-HCTZ 37.5-25 MG PO TABS: 1.0000 | ORAL_TABLET | Freq: Every day | ORAL | 1 refills | Status: DC

## 2019-06-22 MED ORDER — SERTRALINE HCL 50 MG PO TABS: 50.0000 mg | ORAL_TABLET | Freq: Every day | ORAL | 1 refills | Status: DC

## 2019-06-22 NOTE — Patient Instructions (Signed)
° ° ° °  If you have lab work done today you will be contacted with your lab results within the next 2 weeks.  If you have not heard from us then please contact us. The fastest way to get your results is to register for My Chart. ° ° °IF you received an x-ray today, you will receive an invoice from [facility]. Please contact [facility] at [phone] with questions or concerns regarding your invoice.  ° °IF you received labwork today, you will receive an invoice from [facility]. Please contact [facility] at [phone] with questions or concerns regarding your invoice.  ° °Our billing staff will not be able to assist you with questions regarding bills from these companies. ° °You will be contacted with the lab results as soon as they are available. The fastest way to get your results is to activate your My Chart account. Instructions are located on the last page of this paperwork. If you have not heard from us regarding the results in 2 weeks, please contact this office. °  ° ° ° °

## 2019-06-22 NOTE — Progress Notes (Signed)
12/22/20204:23 PM  Ariana Juarez [birth year], 53 y.o., female [id number]  Chief Complaint  Patient presents with  . Follow-up    Chronic conditions. pt has not fasted and has declined blood  work for today.    HPI:   Patient is a 52 y.o. female with past medical history significant for HTN, HLP, prediabetes, vitamin D deficiency, leukocytosis, colonic polyps, depression who presents today requesting refill of medications  Last OV may 2020 - telemedicine Has been working on diet, transitioning to vegan Has not been walking as much since covid States depression is well controlled on sertraline 50mg  daily She takes her meds as prescribed She has no acute concerns today   Lab Results  Component Value Date   HGBA1C 5.7 (H) 01/04/2019   HGBA1C 5.7 (H) 11/06/2017   HGBA1C 5.6 01/10/2017   Lab Results  Component Value Date   LDLCALC 146 (H) 01/04/2019   CREATININE 0.86 01/04/2019    Depression screen PHQ 2/9 06/22/2019 11/06/2017 01/10/2017  Decreased Interest 0 0 0  Down, Depressed, Hopeless 0 0 0  PHQ - 2 Score 0 0 0    Fall Risk  06/22/2019 11/06/2017 01/10/2017 12/26/2015 08/17/2015  Falls in the past year? 0 No No No No     Allergies  Allergen Reactions  . Mobic [Meloxicam]     Patient states she "felt her heart beat" while on med    Prior to Admission medications   Medication Sig Start Date End Date Taking? Authorizing Provider  lisinopril (ZESTRIL) 20 MG tablet TAKE 1 TABLET(20 MG) BY MOUTH DAILY 04/21/19  Yes Rutherford Guys, MD  OVER THE COUNTER MEDICATION Tumeric  500 mg taking daily   Yes [provider]  OVER THE COUNTER MEDICATION Methylation Complete taking daily   Yes [provider]  Probiotic Product (ADVANCED PROBIOTIC 10 PO) Take by mouth.   Yes [provider]  sertraline (ZOLOFT) 50 MG tablet TAKE 1 AND 1/2 TABLETS(75 MG) BY MOUTH DAILY 01/22/19  Yes Rutherford Guys, MD  triamterene-hydrochlorothiazide (MAXZIDE-25) 37.5-25 MG  tablet Take 1 tablet by mouth daily. Needs office visit. 06/03/19  Yes Rutherford Guys, MD    Past Medical History:  Diagnosis Date  . Anemia   . Depression   . Dysmenorrhea   . Endometriosis   . GERD (gastroesophageal reflux disease)   . Glaucoma   . Hyperlipidemia   . Hypertension   . Shortness of breath    on exertion, ie climbing stairs  . 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 DENTAL SURGERY     bone grafting  . DILATION AND CURETTAGE OF UTERUS    . LAPAROSCOPY N/A 11/12/2012   Procedure: LAPAROSCOPY DIAGNOSTIC;  Surgeon: Marylynn Pearson, MD;  Location: [facility];  Service: Gynecology;  Laterality: N/A;    Social History   Tobacco Use  . Smoking status: Never Smoker  . Smokeless tobacco: Never Used  Substance Use Topics  . Alcohol use: Yes    Comment: one drink every 2 years    Family History  Problem Relation Age of Onset  . Cancer Mother 92       uterine  . Cancer Father        lung/liver  . Spina bifida Brother        mild  . Crohn's disease Brother 58  . Colon cancer Neg Hx  Review of Systems  Constitutional: Negative for chills and fever.  Respiratory: Negative for cough and shortness of breath.   Cardiovascular: Negative for chest pain, palpitations and leg swelling.  Gastrointestinal: Negative for abdominal pain, nausea and vomiting.     OBJECTIVE:  Today's Vitals   06/22/19 1616  BP: 130/85  Pulse: 82  Temp: (!) 97.4 F (36.3 C)  SpO2: 96%  Weight: 197 lb 6.4 oz (89.5 kg)  Height: 5\' 4"  (1.626 m)   Body mass index is 33.88 kg/m.  Wt Readings from Last 3 Encounters:  06/22/19 197 lb 6.4 oz (89.5 kg)  11/06/17 201 lb (91.2 kg)  01/10/17 205 lb 6.4 oz (93.2 kg)    Physical Exam Vitals and nursing note reviewed.  Constitutional:      Appearance: She is well-developed.  HENT:     Head: Normocephalic and atraumatic.       Mouth/Throat:     Pharynx: No oropharyngeal exudate.  Eyes:     General: No scleral icterus.    Conjunctiva/sclera: Conjunctivae normal.     Pupils: Pupils are equal, round, and reactive to light.  Cardiovascular:     Rate and Rhythm: Normal rate and regular rhythm.     Heart sounds: Normal heart sounds. No murmur. No friction rub. No gallop.   Pulmonary:     Effort: Pulmonary effort is normal.     Breath sounds: Normal breath sounds. No wheezing or rales.  Musculoskeletal:     Cervical back: Neck supple.     Right lower leg: No edema.     Left lower leg: No edema.  Skin:    General: Skin is warm and dry.  Neurological:     Mental Status: She is alert and oriented to person, place, and time.     No results found for this or any previous visit (from the past 24 hour(s)).  No results found.   ASSESSMENT and PLAN  1. Essential hypertension Controlled. Continue current regime.   2. Hypercholesterolemia Cont with LFM, recheck labs prior to next OV - Comprehensive metabolic panel; Future - Lipid panel; Future  3. Hyperglycemia Cont with LFM, recheck labs prior to next OV - Hemoglobin A1c; Future  4. Vitamin D deficiency - VITAMIN D 25 Hydroxy (Vit-D Deficiency, Fractures); Future  5. Recurrent major depressive disorder, in partial remission ([place]) Controlled. Continue current regime.  - TSH; Future  Other orders - lisinopril (ZESTRIL) 20 MG tablet; TAKE 1 TABLET(20 MG) BY MOUTH DAILY - triamterene-hydrochlorothiazide (MAXZIDE-25) 37.5-25 MG tablet; Take 1 tablet by mouth daily. Needs office visit. - sertraline (ZOLOFT) 50 MG tablet; Take 1 tablet (50 mg total) by mouth daily.  Return in about 6 months (around 12/21/2019) for fasting labs 3-4 days prior to office visit.    Rutherford Guys, MD Primary Care at [facility] [place], [place] [address] Ph.  [phone] Fax [phone]

## 2019-07-07 ENCOUNTER — Ambulatory Visit (AMBULATORY_SURGERY_CENTER): Payer: BC Managed Care – PPO | Admitting: *Deleted

## 2019-07-07 ENCOUNTER — Other Ambulatory Visit: Payer: Self-pay

## 2019-07-07 VITALS — Temp 96.9°F | Ht 64.0 in | Wt 199.0 lb

## 2019-07-07 DIAGNOSIS — Z1159 Encounter for screening for other viral diseases: Secondary | ICD-10-CM

## 2019-07-07 DIAGNOSIS — Z86010 Personal history of colonic polyps: Secondary | ICD-10-CM

## 2019-07-07 MED ORDER — SUPREP BOWEL PREP KIT 17.5-3.13-1.6 GM/177ML PO SOLN: 1.0000 | Freq: Once | ORAL | 0 refills | Status: AC

## 2019-07-07 NOTE — Progress Notes (Signed)
No egg or soy allergy known to patient  No issues with past sedation with any surgeries  or procedures, no intubation problems  No diet pills per patient No home 02 use per patient  No blood thinners per patient  Pt denies issues with constipation  No A fib or A flutter  EMMI video sent to pt's e mail  Suporep $15 coupon   Pt states in the last 3 weeks she has had a very small amt of blood when she wipes at times- no abd pain, no fever   Due to the COVID-19 pandemic we are asking patients to follow these guidelines. Please only bring one care partner. Please be aware that your care partner may wait in the car in the parking lot or if they feel like they will be too hot to wait in the car, they may wait in the lobby on the 4th floor. All care partners are required to wear a mask the entire time (we do not have any that we can provide them), they need to practice social distancing, and we will do a Covid check for all patient's and care partners when you arrive. Also we will check their temperature and your temperature. If the care partner waits in their car they need to stay in the parking lot the entire time and we will call them on their cell phone when the patient is ready for discharge so they can bring the car to the front of the building. Also all patient's will need to wear a mask into building.

## 2019-07-14 ENCOUNTER — Encounter: Payer: Self-pay | Admitting: Gastroenterology

## 2019-07-16 ENCOUNTER — Ambulatory Visit (INDEPENDENT_AMBULATORY_CARE_PROVIDER_SITE_OTHER): Payer: BC Managed Care – PPO

## 2019-07-16 ENCOUNTER — Other Ambulatory Visit: Payer: Self-pay | Admitting: Gastroenterology

## 2019-07-16 DIAGNOSIS — Z1159 Encounter for screening for other viral diseases: Secondary | ICD-10-CM | POA: Diagnosis not present

## 2019-07-16 LAB — SARS CORONAVIRUS 2 (TAT 6-24 HRS): SARS Coronavirus 2: NEGATIVE

## 2019-07-21 ENCOUNTER — Other Ambulatory Visit: Payer: Self-pay

## 2019-07-21 ENCOUNTER — Ambulatory Visit (AMBULATORY_SURGERY_CENTER): Payer: BC Managed Care – PPO | Admitting: Gastroenterology

## 2019-07-21 ENCOUNTER — Encounter: Payer: Self-pay | Admitting: Gastroenterology

## 2019-07-21 VITALS — BP 149/77 | HR 88 | Temp 98.9°F | Resp 13 | Ht 64.0 in | Wt 199.0 lb

## 2019-07-21 DIAGNOSIS — D123 Benign neoplasm of transverse colon: Secondary | ICD-10-CM

## 2019-07-21 DIAGNOSIS — D125 Benign neoplasm of sigmoid colon: Secondary | ICD-10-CM

## 2019-07-21 DIAGNOSIS — Z86010 Personal history of colonic polyps: Secondary | ICD-10-CM

## 2019-07-21 DIAGNOSIS — Z1211 Encounter for screening for malignant neoplasm of colon: Secondary | ICD-10-CM | POA: Diagnosis not present

## 2019-07-21 DIAGNOSIS — D124 Benign neoplasm of descending colon: Secondary | ICD-10-CM | POA: Diagnosis not present

## 2019-07-21 MED ORDER — SODIUM CHLORIDE 0.9 % IV SOLN: 500.0000 mL | Freq: Once | INTRAVENOUS | Status: DC

## 2019-07-21 NOTE — Patient Instructions (Signed)
Information on polyps given to you today.  Await pathology results.   YOU HAD AN ENDOSCOPIC PROCEDURE TODAY AT THE [facility]:   Refer to the procedure report that was given to you for any specific questions about what was found during the examination.  If the procedure report does not answer your questions, please call your gastroenterologist to clarify.  If you requested that your care partner not be given the details of your procedure findings, then the procedure report has been included in a sealed envelope for you to review at your convenience later.  YOU SHOULD EXPECT: Some feelings of bloating in the abdomen. Passage of more gas than usual.  Walking can help get rid of the air that was put into your GI tract during the procedure and reduce the bloating. If you had a lower endoscopy (such as a colonoscopy or flexible sigmoidoscopy) you may notice spotting of blood in your stool or on the toilet paper. If you underwent a bowel prep for your procedure, you may not have a normal bowel movement for a few days.  Please Note:  You might notice some irritation and congestion in your nose or some drainage.  This is from the oxygen used during your procedure.  There is no need for concern and it should clear up in a day or so.  SYMPTOMS TO REPORT IMMEDIATELY:   Following lower endoscopy (colonoscopy or flexible sigmoidoscopy):  Excessive amounts of blood in the stool  Significant tenderness or worsening of abdominal pains  Swelling of the abdomen that is new, acute  Fever of 100F or higher   For urgent or emergent issues, a gastroenterologist can be reached at any hour by calling [phone].   DIET:  We do recommend a small meal at first, but then you may proceed to your regular diet.  Drink plenty of fluids but you should avoid alcoholic beverages for 24 hours.  ACTIVITY:  You should plan to take it easy for the rest of today and you should NOT DRIVE or use heavy machinery  until tomorrow (because of the sedation medicines used during the test).    FOLLOW UP: Our staff will call the number listed on your records 48-72 hours following your procedure to check on you and address any questions or concerns that you may have regarding the information given to you following your procedure. If we do not reach you, we will leave a message.  We will attempt to reach you two times.  During this call, we will ask if you have developed any symptoms of COVID 19. If you develop any symptoms (ie: fever, flu-like symptoms, shortness of breath, cough etc.) before then, please call [phone].  If you test positive for Covid 19 in the 2 weeks post procedure, please call and report this information to us.    If any biopsies were taken you will be contacted by phone or by letter within the next 1-3 weeks.  Please call us at [phone] if you have not heard about the biopsies in 3 weeks.    SIGNATURES/CONFIDENTIALITY: You and/or your care partner have signed paperwork which will be entered into your electronic medical record.  These signatures attest to the fact that that the information above on your After Visit Summary has been reviewed and is understood.  Full responsibility of the confidentiality of this discharge information lies with you and/or your care-partner. 

## 2019-07-21 NOTE — Op Note (Signed)
[place] Patient Name: Ariana Juarez Procedure Date: 07/21/2019 9:20 AM MRN: [id number] Endoscopist: Milus Banister , MD Age: 54 Referring MD:  Date of Birth: [birth year] Gender: Female Account #: [id number] Procedure:                Colonoscopy Indications:              High risk colon cancer surveillance: Personal                            history of colonic polyps; Colonoscopy 2015 three                            polyps removed including a 8mm TA Medicines:                Monitored Anesthesia Care Procedure:                Pre-Anesthesia Assessment:                           - Prior to the procedure, a History and Physical                            was performed, and patient medications and                            allergies were reviewed. The patient's tolerance of                            previous anesthesia was also reviewed. The risks                            and benefits of the procedure and the sedation                            options and risks were discussed with the patient.                            All questions were answered, and informed consent                            was obtained. Prior Anticoagulants: The patient has                            taken no previous anticoagulant or antiplatelet                            agents. ASA Grade Assessment: II - A patient with                            mild systemic disease. After reviewing the risks                            and benefits, the patient was deemed in  satisfactory condition to undergo the procedure.                           After obtaining informed consent, the colonoscope                            was passed under direct vision. Throughout the                            procedure, the patient's blood pressure, pulse, and                            oxygen saturations were monitored continuously. The                            Colonoscope was introduced  through the anus and                            advanced to the the cecum, identified by                            appendiceal orifice and ileocecal valve. The                            colonoscopy was performed without difficulty. The                            patient tolerated the procedure well. The quality                            of the bowel preparation was good. The ileocecal                            valve, appendiceal orifice, and rectum were                            photographed. Scope In: 9:29:56 AM Scope Out: 9:41:01 AM Scope Withdrawal Time: 0 hours 8 minutes 48 seconds  Total Procedure Duration: 0 hours 11 minutes 5 seconds  Findings:                 Small obvious lipoma in the ascending colon.                           Four sessile polyps were found in the sigmoid                            colon, descending colon and transverse colon. The                            polyps were 2 to 3 mm in size. These polyps were                            removed with a cold snare. Resection and retrieval  were complete.                           The exam was otherwise without abnormality on                            direct and retroflexion views. Complications:            No immediate complications. Estimated blood loss:                            None. Estimated Blood Loss:     Estimated blood loss: none. Impression:               - Four 2 to 3 mm polyps in the sigmoid colon, in                            the descending colon and in the transverse colon,                            removed with a cold snare. Resected and retrieved.                           - Lipoma in the ascending colon.                           - The examination was otherwise normal on direct                            and retroflexion views. Recommendation:           - Patient has a contact number available for                            emergencies. The signs and symptoms of  potential                            delayed complications were discussed with the                            patient. Return to normal activities tomorrow.                            Written discharge instructions were provided to the                            patient.                           - Resume previous diet.                           - Continue present medications.                           - Await pathology results. Milus Banister, MD 07/21/2019 9:47:25 AM This report has been signed  electronically.

## 2019-07-21 NOTE — Progress Notes (Signed)
Report given to PACU, vss 

## 2019-07-21 NOTE — Progress Notes (Signed)
Pt's states no medical or surgical changes since previsit or office visit. 

## 2019-07-21 NOTE — Progress Notes (Signed)
Called to room to assist during endoscopic procedure.  Patient ID and intended procedure confirmed with present staff. Received instructions for my participation in the procedure from the performing physician.  

## 2019-07-23 ENCOUNTER — Telehealth: Payer: Self-pay | Admitting: *Deleted

## 2019-07-23 NOTE — Telephone Encounter (Signed)
  Follow up Call-  Call back number 07/21/2019  Post procedure Call Back phone  # [phone]  Permission to leave phone message Yes  Some recent data might be hidden     Patient questions:  Do you have a fever, pain , or abdominal swelling? No. Pain Score  0 *  Have you tolerated food without any problems? Yes.    Have you been able to return to your normal activities? Yes.    Do you have any questions about your discharge instructions: Diet   No. Medications  No. Follow up visit  No.  Do you have questions or concerns about your Care? No.  Actions: * If pain score is 4 or above: No action needed, pain <4.  1. Have you developed a fever since your procedure? no  2.   Have you had an respiratory symptoms (SOB or cough) since your procedure? no  3.   Have you tested positive for COVID 19 since your procedure no  4.   Have you had any family members/close contacts diagnosed with the COVID 19 since your procedure?  no   If yes to any of these questions please route to Joylene John, RN and Alphonsa Gin, Therapist, sports.

## 2019-07-29 ENCOUNTER — Encounter: Payer: Self-pay | Admitting: Gastroenterology

## 2019-11-04 ENCOUNTER — Telehealth: Payer: Self-pay | Admitting: Family Medicine

## 2019-11-04 NOTE — Telephone Encounter (Signed)
11/04/2019 - PATIENT HAS AN APPOINTMENT SCHEDULED WITH DR. Benay Spice ON Friday 12/24/2019. DR. Benay Spice WILL BE OUT OF THE OFFICE. I TRIED TO CALL AND RESCHEDULE BUT HAD TO LEAVE A MESSAGE ON HER VOICE MAIL TO RETURN MY CALL. SHE ALSO HAS A NURSE ONLY APPOINTMENT FOR Monday 12/20/2019 WHICH SHE CAN KEEP. I HAVE CANCELLED HER OFF THE DAR FOR 12/24/2019.[place]

## 2019-12-20 ENCOUNTER — Ambulatory Visit (INDEPENDENT_AMBULATORY_CARE_PROVIDER_SITE_OTHER): Payer: BC Managed Care – PPO | Admitting: Emergency Medicine

## 2019-12-20 ENCOUNTER — Other Ambulatory Visit: Payer: Self-pay

## 2019-12-20 DIAGNOSIS — E559 Vitamin D deficiency, unspecified: Secondary | ICD-10-CM | POA: Diagnosis not present

## 2019-12-20 DIAGNOSIS — F3341 Major depressive disorder, recurrent, in partial remission: Secondary | ICD-10-CM

## 2019-12-20 DIAGNOSIS — E7800 Pure hypercholesterolemia, unspecified: Secondary | ICD-10-CM | POA: Diagnosis not present

## 2019-12-20 DIAGNOSIS — R739 Hyperglycemia, unspecified: Secondary | ICD-10-CM | POA: Diagnosis not present

## 2019-12-21 LAB — LIPID PANEL
Chol/HDL Ratio: 5.2 ratio — ABNORMAL HIGH (ref 0.0–4.4)
Cholesterol, Total: 236 mg/dL — ABNORMAL HIGH (ref 100–199)
HDL: 45 mg/dL (ref >39)
LDL Chol Calc (NIH): 155 mg/dL — ABNORMAL HIGH (ref 0–99)
Triglycerides: 197 mg/dL — ABNORMAL HIGH (ref 0–149)
VLDL Cholesterol Cal: 36 mg/dL (ref 5–40)

## 2019-12-21 LAB — COMPREHENSIVE METABOLIC PANEL
ALT: 17 IU/L (ref 0–32)
AST: 14 IU/L (ref 0–40)
Albumin/Globulin Ratio: 1.8 (ref 1.2–2.2)
Albumin: 4.3 g/dL (ref 3.8–4.9)
Alkaline Phosphatase: 66 IU/L (ref 48–121)
BUN/Creatinine Ratio: 20 (ref 9–23)
BUN: 15 mg/dL (ref 6–24)
Bilirubin Total: 0.4 mg/dL (ref 0.0–1.2)
CO2: 22 mmol/L (ref 20–29)
Calcium: 9.7 mg/dL (ref 8.7–10.2)
Chloride: 102 mmol/L (ref 96–106)
Creatinine, Ser: 0.76 mg/dL (ref 0.57–1.00)
GFR calc Af Amer: 101 mL/min/{1.73_m2} (ref >59)
GFR calc non Af Amer: 88 mL/min/{1.73_m2} (ref >59)
Globulin, Total: 2.4 g/dL (ref 1.5–4.5)
Glucose: 95 mg/dL (ref 65–99)
Potassium: 4.4 mmol/L (ref 3.5–5.2)
Sodium: 141 mmol/L (ref 134–144)
Total Protein: 6.7 g/dL (ref 6.0–8.5)

## 2019-12-21 LAB — HEMOGLOBIN A1C
Est. average glucose Bld gHb Est-mCnc: 111 mg/dL
Hgb A1c MFr Bld: 5.5 % (ref 4.8–5.6)

## 2019-12-21 LAB — VITAMIN D 25 HYDROXY (VIT D DEFICIENCY, FRACTURES): Vit D, 25-Hydroxy: 35.2 ng/mL (ref 30.0–100.0)

## 2019-12-21 LAB — TSH: TSH: 1.89 u[IU]/mL (ref 0.450–4.500)

## 2019-12-24 ENCOUNTER — Ambulatory Visit: Payer: BC Managed Care – PPO | Admitting: Family Medicine

## 2020-01-08 ENCOUNTER — Other Ambulatory Visit: Payer: Self-pay | Admitting: Family Medicine

## 2020-01-08 NOTE — Telephone Encounter (Signed)
30 day courtesy RF- please advise pt to call and make appt for further RF Requested Prescriptions  Pending Prescriptions Disp Refills  . triamterene-hydrochlorothiazide (MAXZIDE-25) 37.5-25 MG tablet [Pharmacy Med Name: TRIAMTERENE 37.5MG / HCTZ 25MG  TABS] 30 tablet 0    Sig: TAKE 1 TABLET BY MOUTH DAILY     Cardiovascular: Diuretic Combos Failed - 01/08/2020  1:44 PM      Failed - Last BP in normal range    BP Readings from Last 1 Encounters:  07/21/19 (!) 149/77         Failed - Valid encounter within last 6 months    Recent Outpatient Visits          2 weeks ago Vitamin D deficiency   [facility], [place], [place]   6 months ago Essential hypertension   [facility], Lilia Argue, MD   1 year ago Vitamin D deficiency   [facility], Lilia Argue, MD   1 year ago Essential hypertension   [facility], Lilia Argue, MD   2 years ago Annual physical exam   [facility], Laurey Arrow, MD             Passed - K in normal range and within 360 days    Potassium  Date Value Ref Range Status  12/20/2019 4.4 3.5 - 5.2 mmol/L Final  09/27/2014 3.4 (L) 3.5 - 5.1 mEq/L Final         Passed - Na in normal range and within 360 days    Sodium  Date Value Ref Range Status  12/20/2019 141 134 - 144 mmol/L Final  09/27/2014 142 136 - 145 mEq/L Final         Passed - Cr in normal range and within 360 days    Creatinine  Date Value Ref Range Status  09/27/2014 0.8 0.6 - 1.1 mg/dL Final   Creat  Date Value Ref Range Status  12/26/2015 0.88 0.50 - 1.05 mg/dL Final    Comment:      For patients > or = 56 years of age: The upper reference limit for Creatinine is approximately 13% higher for people identified as African-American.      Creatinine, Ser  Date Value Ref Range Status  12/20/2019 0.76 0.57 - 1.00 mg/dL Final         Passed - Ca in normal range and within 360 days    Calcium  Date Value Ref Range  Status  12/20/2019 9.7 8.7 - 10.2 mg/dL Final  09/27/2014 9.5 8.4 - 10.4 mg/dL Final

## 2020-02-04 ENCOUNTER — Other Ambulatory Visit: Payer: Self-pay

## 2020-02-04 ENCOUNTER — Ambulatory Visit (INDEPENDENT_AMBULATORY_CARE_PROVIDER_SITE_OTHER): Payer: BC Managed Care – PPO | Admitting: Family Medicine

## 2020-02-04 ENCOUNTER — Ambulatory Visit (INDEPENDENT_AMBULATORY_CARE_PROVIDER_SITE_OTHER): Payer: BC Managed Care – PPO

## 2020-02-04 ENCOUNTER — Encounter: Payer: Self-pay | Admitting: Family Medicine

## 2020-02-04 VITALS — BP 116/76 | HR 74 | Temp 97.7°F | Ht 64.0 in | Wt 199.2 lb

## 2020-02-04 DIAGNOSIS — M222X2 Patellofemoral disorders, left knee: Secondary | ICD-10-CM

## 2020-02-04 DIAGNOSIS — M25562 Pain in left knee: Secondary | ICD-10-CM

## 2020-02-04 MED ORDER — SERTRALINE HCL 50 MG PO TABS: 50.0000 mg | ORAL_TABLET | Freq: Every day | ORAL | 0 refills | Status: DC

## 2020-02-04 MED ORDER — TRIAMTERENE-HCTZ 37.5-25 MG PO TABS: 1.0000 | ORAL_TABLET | Freq: Every day | ORAL | 0 refills | Status: DC

## 2020-02-04 MED ORDER — LISINOPRIL 20 MG PO TABS: ORAL_TABLET | ORAL | 0 refills | Status: DC

## 2020-02-04 NOTE — Patient Instructions (Addendum)
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  I will refer you to physical therapy.  See information below.  Tylenol as needed is fine for now.   I did refill meds temporarily, but please follow up with Dr. Pamella Pert to discuss those in next 4-6 weeks.   Patellofemoral Pain Syndrome  Patellofemoral pain syndrome is a condition in which the tissue (cartilage) on the underside of the kneecap (patella) softens or breaks down. This causes pain in the front of the knee. The condition is also called runner's knee or chondromalacia patella. Patellofemoral pain syndrome is most common in young adults who are active in sports. The knee is the largest joint in the body. The patella covers the front of the knee and is attached to muscles above and below the knee. The underside of the patella is covered with a smooth type of cartilage (synovium). The smooth surface helps the patella to glide easily when you move your knee. Patellofemoral pain syndrome causes swelling in the joint linings and bone surfaces in the knee. What are the causes? This condition may be caused by:  Overuse of the knee.  Poor alignment of your knee joints.  Weak leg muscles.  A direct blow to your kneecap. What increases the risk? You are more likely to develop this condition if:  You do a lot of activities that can wear down your kneecap. These include: ? Running. ? Squatting. ? Climbing stairs.  You start a new physical activity or exercise program.  You wear shoes that do not fit well.  You do not have good leg strength.  You are overweight. What are the signs or symptoms? The main symptom of this condition is knee pain. This may feel like a dull, aching pain underneath your patella, in the front of your knee. There may be a popping or cracking sound when you move your knee. Pain may get worse with:  Exercise.  Climbing stairs.  Running.  Jumping.  Squatting.  Kneeling.  Sitting for a [place]  time.  Moving or pushing on your patella. How is this diagnosed? This condition may be diagnosed based on:  Your symptoms and medical history. You may be asked about your recent physical activities and which ones cause knee pain.  A physical exam. This may include: ? Moving your patella back and forth. ? Checking your range of knee motion. ? Having you squat or jump to see if you have pain. ? Checking the strength of your leg muscles.  Imaging tests to confirm the diagnosis. These may include an MRI of your knee. How is this treated? This condition may be treated at home with rest, ice, compression, and elevation (RICE).  Other treatments may include:  Nonsteroidal anti-inflammatory drugs (NSAIDs).  Physical therapy to stretch and strengthen your leg muscles.  Shoe inserts (orthotics) to take stress off your knee.  A knee brace or knee support.  Adhesive tapes to the skin.  Surgery to remove damaged cartilage or move the patella to a better position. This is rare. Follow these instructions at home: If you have a shoe or brace:  Wear the shoe or brace as told by your health care provider. Remove it only as told by your health care provider.  Loosen the shoe or brace if your toes tingle, become numb, or turn cold and blue.  Keep the shoe or brace clean.  If the shoe or brace is not waterproof: ? Do not let it get wet. ? Cover it  with a watertight covering when you take a bath or a shower. Managing pain, stiffness, and swelling  If directed, put ice on the painful area. ? If you have a removable shoe or brace, remove it as told by your health care provider. ? Put ice in a plastic bag. ? Place a towel between your skin and the bag. ? Leave the ice on for 20 minutes, 2-3 times a day.  Move your toes often to avoid stiffness and to lessen swelling.  Rest your knee: ? Avoid activities that cause knee pain. ? When sitting or lying down, raise (elevate) the injured area  above the level of your heart, whenever possible. General instructions  Take over-the-counter and prescription medicines only as told by your health care provider.  Use splints, braces, knee supports, or walking aids as directed by your health care provider.  Perform stretching and strengthening exercises as told by your health care provider or physical therapist.  Do not use any products that contain nicotine or tobacco, such as cigarettes and e-cigarettes. These can delay healing. If you need help quitting, ask your health care provider.  Return to your normal activities as told by your health care provider. Ask your health care provider what activities are safe for you.  Keep all follow-up visits as told by your health care provider. This is important. Contact a health care provider if:  Your symptoms get worse.  You are not improving with home care. Summary  Patellofemoral pain syndrome is a condition in which the tissue (cartilage) on the underside of the kneecap (patella) softens or breaks down.  This condition causes swelling in the joint linings and bone surfaces in the knee. This leads to pain in the front of the knee.  This condition may be treated at home with rest, ice, compression, and elevation (RICE).  Use splints, braces, knee supports, or walking aids as directed by your health care provider. This information is not intended to replace advice given to you by your health care provider. Make sure you discuss any questions you have with your health care provider. Document Revised: 07/28/2017 Document Reviewed: 07/28/2017 Elsevier Patient Education  [employer].   If you have lab work done today you will be contacted with your lab results within the next 2 weeks.  If you have not heard from [place] then please contact us. The fastest way to get your results is to register for My Chart.   IF you received an x-ray today, you will receive an invoice from [facility]. Please contact [facility] at [phone] with questions or concerns regarding your invoice.   IF you received labwork today, you will receive an invoice from [facility]. Please contact [facility] at [phone] with questions or concerns regarding your invoice.   Our billing staff will not be able to assist you with questions regarding bills from these companies.  You will be contacted with the lab results as soon as they are available. The fastest way to get your results is to activate your My Chart account. Instructions are located on the last page of this paperwork. If you have not heard from [place] regarding the results in 2 weeks, please contact this office.

## 2020-02-04 NOTE — Progress Notes (Signed)
Subjective:  Patient ID: Ariana Juarez, female    DOB: 02-17-1966  Age: 54 y.o. MRN: [id number]  CC:  Chief Complaint  Patient presents with  . left knee pain    weight on back of knee cap hurts the knee- going on a year and it getting worse some  . med refills    HPI Ariana Juarez presents for  Left knee pain.  PCP is Dr. Pamella Pert  L knee pain: Did have fall 1 year ago, but no l knee injury known at that time.  Pain with going down stairs, sharp pain behind kneecap. Present past year. Limiting her aerobics. walking and jogging ok. Sore to squat.  Positive movie theater sign. No locking/giving way.  Tx: copper sleeve - min relief. biofreeze once. Herbal supplement stinging nettle.  no relief.     History Patient Active Problem List   Diagnosis Date Noted  . Hyperglycemia 06/22/2019  . Obesity (BMI 30.0-34.9) 03/02/2015  . Polycythemia 09/27/2014  . Vitamin D deficiency 09/04/2014  . Metabolic syndrome 40/34/7425  . Leukocytosis 09/04/2014  . Endometriosis 09/04/2014  . Gallstones 06/02/2013  . Depression 08/27/2012  . Dysmenorrhea   . HTN (hypertension) 12/19/2011  . Hypercholesterolemia 12/19/2011   Past Medical History:  Diagnosis Date  . Allergy   . Anemia   . Depression   . Dysmenorrhea   . Endometriosis   . GERD (gastroesophageal reflux disease)   . Glaucoma   . Hyperlipidemia   . Hypertension   . Shortness of breath    on exertion, ie climbing stairs  . 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 COLONOSCOPY    . DENTAL SURGERY     bone grafting  . DILATION AND CURETTAGE OF UTERUS    . LAPAROSCOPY N/A 11/12/2012   Procedure: LAPAROSCOPY DIAGNOSTIC;  Surgeon: Marylynn Pearson, MD;  Location: [facility];  Service: Gynecology;  Laterality: N/A;  . POLYPECTOMY     Allergies  Allergen Reactions  . Mobic [Meloxicam]     Patient states she "felt her  heart beat" while on med   Prior to Admission medications   Medication Sig Start Date End Date Taking? Authorizing Provider  Ascorbic Acid (VITAMIN C) 1000 MG tablet Take 1,000 mg by mouth daily.   Yes [provider]  ASHWAGANDHA PO Take by mouth.   Yes [provider]  Beta Carotene (VITAMIN A) 25000 UNIT capsule Take 25,000 Units by mouth daily.   Yes [provider]  BLACK ELDERBERRY PO Take by mouth. Homemade syrup daily   Yes [provider]  CALCIUM MAGNESIUM 750 PO Take by mouth.   Yes [provider]  cholecalciferol (VITAMIN D3) 25 MCG (1000 UT) tablet Take 1,000 Units by mouth daily.   Yes [provider]  CHROMIUM PICOLINATE PO Take by mouth.   Yes [provider]  GARLIC PO Take by mouth.   Yes [provider]  lisinopril (ZESTRIL) 20 MG tablet TAKE 1 TABLET(20 MG) BY MOUTH DAILY 06/22/19  Yes Rutherford Guys, MD  Nutritional Supplements (DHEA PO) Take by mouth.   Yes [provider]  OIL OF OREGANO PO Take by mouth.   Yes [provider]  OLIVE LEAF PO Take by mouth.   Yes [provider]  OVER THE COUNTER MEDICATION Tumeric  500 mg taking daily   Yes [provider]  OVER THE COUNTER MEDICATION Methylation Complete taking daily   Yes [provider]  OVER THE COUNTER MEDICATION ASEA daily   Yes [provider]  Pregnenolone Micronized 50 MG TABS Take by mouth.   Yes [provider]  Probiotic Product (ADVANCED PROBIOTIC 10 PO) Take by mouth.   Yes [provider]  Red Yeast Rice Extract (RED YEAST RICE PO) Take by mouth.   Yes [provider]  sertraline (ZOLOFT) 50 MG tablet Take 1 tablet (50 mg total) by mouth daily. 06/22/19  Yes Rutherford Guys, MD  Specialty Vitamins Products (BIOTIN PLUS KERATIN) 10000-100 MCG-MG TABS Take by mouth daily.   Yes [provider]  triamterene-hydrochlorothiazide (MAXZIDE-25)  37.5-25 MG tablet TAKE 1 TABLET BY MOUTH DAILY 01/08/20  Yes Rutherford Guys, MD  vitamin E 100 UNIT capsule Take by mouth daily.   Yes [provider]  vitamin k 100 MCG tablet Take 100 mcg by mouth daily.   Yes [provider]  zinc gluconate 50 MG tablet Take 50 mg by mouth daily.   Yes [provider]   Social History   Socioeconomic History  . Marital status: Divorced    Spouse name: n/a  . Number of children: 0  . Years of education: 40  . Highest education level: Not on file  Occupational History  . Occupation: Runner, broadcasting/film/video    Employer: [employer]  Tobacco Use  . Smoking status: Never Smoker  . Smokeless tobacco: Never Used  Substance and Sexual Activity  . Alcohol use: Yes    Comment: one drink every 2 years  . Drug use: No  . Sexual activity: Not Currently    Partners: Male  Other Topics Concern  . Not on file  Social History Narrative   Lives with her mother and brother, and her maternal uncle.   Social Determinants of Health   Financial Resource Strain:   . Difficulty of Paying Living Expenses:   Food Insecurity:   . Worried About Charity fundraiser in the Last Year:   . Arboriculturist in the Last Year:   Transportation Needs:   . Film/video editor (Medical):   Marland Kitchen Lack of Transportation (Non-Medical):   Physical Activity:   . Days of Exercise per Week:   . Minutes of Exercise per Session:   Stress:   . Feeling of Stress :   Social Connections:   . Frequency of Communication with Friends and Family:   . Frequency of Social Gatherings with Friends and Family:   . Attends Religious Services:   . Active Member of Clubs or Organizations:   . Attends Archivist Meetings:   Marland Kitchen Marital Status:   Intimate Partner Violence:   . Fear of Current or Ex-Partner:   . Emotionally Abused:   Marland Kitchen Physically Abused:   . Sexually Abused:     Review of Systems  Per HPI.  Objective:   Vitals:   02/04/20 0959  BP: 116/76    Pulse: 74  Temp: 97.7 F (36.5 C)  TempSrc: Temporal  SpO2: 96%  Weight: 199 lb 3.2 oz (90.4 kg)  Height: 5\' 4"  (1.626 m)     Physical Exam Constitutional:      General: She is not in acute distress.    Appearance: She is well-developed.  HENT:     Head: Normocephalic and atraumatic.  Cardiovascular:     Rate and Rhythm: Normal rate.     Pulses: Normal pulses.  Pulmonary:     Effort: Pulmonary effort is normal.  Musculoskeletal:        General: No swelling, tenderness or deformity.     Left lower leg: No edema.     Comments: Right knee full range of motion, no focal bony tenderness, no effusion.  Left knee full range of motion with minimal crepitus.  Skin intact, no effusion, no erythema.  No focal bony tenderness.  Negative varus/valgus stress, negative McMurray, negative drawer.  Negative Clark's patellar compression testing, no J sign.  Does describe area of discomfort underneath patella, patellar tendon nontender.  Neurological:     Mental Status: She is alert and oriented to person, place, and time.      Fair VMO bulk.  Assessment & Plan:  Ariana Juarez is a 54 y.o. female . Patellofemoral syndrome of left knee - Plan: Ambulatory referral to Physical Therapy, DG Knee Complete 4 Views Left  Left knee pain, unspecified chronicity - Plan: Ambulatory referral to Physical Therapy, DG Knee Complete 4 Views Left  Suspected patellofemoral pain syndrome of left knee.  Will check x-ray but remote history of injury, less likely arthritic cause.  Medial/lateral joint line is nontender.   - Referred to physical therapy for VMO strengthening, other treatment for patellofemoral pain syndrome, handout given.  Episodic Tylenol if needed, hold on bracing at this time.  RTC precautions  Recent lab work noted, temporary refill of medications until she can see her PCP.  Meds ordered this encounter  Medications  . triamterene-hydrochlorothiazide (MAXZIDE-25) 37.5-25 MG tablet    Sig:  Take 1 tablet by mouth daily.    Dispense:  90 tablet    Refill:  0  . lisinopril (ZESTRIL) 20 MG tablet    Sig: TAKE 1 TABLET(20 MG) BY MOUTH DAILY    Dispense:  90 tablet    Refill:  0  . sertraline (ZOLOFT) 50 MG tablet    Sig: Take 1 tablet (50 mg total) by mouth daily.    Dispense:  90 tablet    Refill:  0   Patient Instructions   Knee pain appears to be patellofemoral pain syndrome.  I will refer you to physical therapy.  See information below.  Tylenol as needed is fine for now.   I did refill meds temporarily, but please follow up with Dr. Pamella Pert to discuss those in next 4-6 weeks.   Patellofemoral Pain Syndrome  Patellofemoral pain syndrome is a condition in which the tissue (cartilage) on the underside of the kneecap (patella) softens or breaks down. This causes pain in the front of the knee. The condition is also called runner's knee or chondromalacia patella. Patellofemoral pain syndrome is most common in young adults who are active in sports. The knee is the largest joint in the body. The patella covers the front of the knee and is attached to muscles above and below the knee. The underside of the patella is covered with a smooth type of cartilage (synovium). The smooth surface helps the patella to glide easily when you move your knee. Patellofemoral pain syndrome causes swelling in the joint linings and bone surfaces in the knee. What are the causes? This condition may be caused by:  Overuse of the knee.  Poor alignment of your knee joints.  Weak leg muscles.  A direct blow to your kneecap. What increases the risk? You are more likely to develop this condition if:  You do a lot of activities that can wear down your kneecap. These include: ?  Running. ? Squatting. ? Climbing stairs.  You start a new physical activity or exercise program.  You wear shoes that do not fit well.  You do not have good leg strength.  You are overweight. What are the signs or  symptoms? The main symptom of this condition is knee pain. This may feel like a dull, aching pain underneath your patella, in the front of your knee. There may be a popping or cracking sound when you move your knee. Pain may get worse with:  Exercise.  Climbing stairs.  Running.  Jumping.  Squatting.  Kneeling.  Sitting for a Gow time.  Moving or pushing on your patella. How is this diagnosed? This condition may be diagnosed based on:  Your symptoms and medical history. You may be asked about your recent physical activities and which ones cause knee pain.  A physical exam. This may include: ? Moving your patella back and forth. ? Checking your range of knee motion. ? Having you squat or jump to see if you have pain. ? Checking the strength of your leg muscles.  Imaging tests to confirm the diagnosis. These may include an MRI of your knee. How is this treated? This condition may be treated at home with rest, ice, compression, and elevation (RICE).  Other treatments may include:  Nonsteroidal anti-inflammatory drugs (NSAIDs).  Physical therapy to stretch and strengthen your leg muscles.  Shoe inserts (orthotics) to take stress off your knee.  A knee brace or knee support.  Adhesive tapes to the skin.  Surgery to remove damaged cartilage or move the patella to a better position. This is rare. Follow these instructions at home: If you have a shoe or brace:  Wear the shoe or brace as told by your health care provider. Remove it only as told by your health care provider.  Loosen the shoe or brace if your toes tingle, become numb, or turn cold and blue.  Keep the shoe or brace clean.  If the shoe or brace is not waterproof: ? Do not let it get wet. ? Cover it with a watertight covering when you take a bath or a shower. Managing pain, stiffness, and swelling  If directed, put ice on the painful area. ? If you have a removable shoe or brace, remove it as told by  your health care provider. ? Put ice in a plastic bag. ? Place a towel between your skin and the bag. ? Leave the ice on for 20 minutes, 2-3 times a day.  Move your toes often to avoid stiffness and to lessen swelling.  Rest your knee: ? Avoid activities that cause knee pain. ? When sitting or lying down, raise (elevate) the injured area above the level of your heart, whenever possible. General instructions  Take over-the-counter and prescription medicines only as told by your health care provider.  Use splints, braces, knee supports, or walking aids as directed by your health care provider.  Perform stretching and strengthening exercises as told by your health care provider or physical therapist.  Do not use any products that contain nicotine or tobacco, such as cigarettes and e-cigarettes. These can delay healing. If you need help quitting, ask your health care provider.  Return to your normal activities as told by your health care provider. Ask your health care provider what activities are safe for you.  Keep all follow-up visits as told by your health care provider. This is important. Contact a health care provider if:  Your symptoms  get worse.  You are not improving with home care. Summary  Patellofemoral pain syndrome is a condition in which the tissue (cartilage) on the underside of the kneecap (patella) softens or breaks down.  This condition causes swelling in the joint linings and bone surfaces in the knee. This leads to pain in the front of the knee.  This condition may be treated at home with rest, ice, compression, and elevation (RICE).  Use splints, braces, knee supports, or walking aids as directed by your health care provider. This information is not intended to replace advice given to you by your health care provider. Make sure you discuss any questions you have with your health care provider. Document Revised: 07/28/2017 Document Reviewed: 07/28/2017 Elsevier  Patient Education  [employer].   If you have lab work done today you will be contacted with your lab results within the next 2 weeks.  If you have not heard from [place] then please contact us. The fastest way to get your results is to register for My Chart.   IF you received an x-ray today, you will receive an invoice from [facility]. Please contact [facility] at [phone] with questions or concerns regarding your invoice.   IF you received labwork today, you will receive an invoice from [facility]. Please contact [facility] at [phone] with questions or concerns regarding your invoice.   Our billing staff will not be able to assist you with questions regarding bills from these companies.  You will be contacted with the lab results as soon as they are available. The fastest way to get your results is to activate your My Chart account. Instructions are located on the last page of this paperwork. If you have not heard from [place] regarding the results in 2 weeks, please contact this office.          Signed, Merri Ray, MD [facility]

## 2020-02-17 ENCOUNTER — Ambulatory Visit: Payer: BC Managed Care – PPO | Attending: Family Medicine | Admitting: Physical Therapy

## 2020-02-17 ENCOUNTER — Other Ambulatory Visit: Payer: Self-pay

## 2020-02-17 ENCOUNTER — Encounter: Payer: Self-pay | Admitting: Physical Therapy

## 2020-02-17 DIAGNOSIS — R29898 Other symptoms and signs involving the musculoskeletal system: Secondary | ICD-10-CM | POA: Insufficient documentation

## 2020-02-17 DIAGNOSIS — M25562 Pain in left knee: Secondary | ICD-10-CM | POA: Diagnosis not present

## 2020-02-17 NOTE — Patient Instructions (Signed)
Access Code: [email or web address] URL: [email or web address] Date: 02/17/2020 Prepared by: Jeral Pinch  Exercises Straight Leg Raise with External Rotation - 1 x daily - 3 sets - 10 reps Sidelying Hip Adduction with Ankle Weight - Leg Behind - 1 x daily - 3 sets - 10 reps Prone Quadriceps Stretch with Strap - 1 x daily - 1-2 reps - 45 hold Supine Hamstring Stretch with Strap - 1 x daily - 1-2 reps - 45 hold

## 2020-02-17 NOTE — Therapy (Signed)
[place], [place], [address] Phone: [phone]   Fax:  [phone]  Physical Therapy Evaluation  Patient Details  Name: Ariana Juarez MRN: [id number] Date of Birth: 13-Dec-1965 Referring Provider (PT): Dr Jacklyn Shell   Encounter Date: 02/17/2020   PT End of Session - 02/17/20 0753    Visit Number 1    Number of Visits 4    Date for PT Re-Evaluation 04/20/20    Authorization Type BCBS    PT Start Time 0753    PT Stop Time 0836    PT Time Calculation (min) 43 min    Activity Tolerance Patient tolerated treatment well    Behavior During Therapy [facility] for tasks assessed/performed           Past Medical History:  Diagnosis Date  . Allergy   . Anemia   . Depression   . Dysmenorrhea   . Endometriosis   . GERD (gastroesophageal reflux disease)   . Glaucoma   . Hyperlipidemia   . Hypertension   . Shortness of breath    on exertion, ie climbing stairs  . 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 COLONOSCOPY    . DENTAL SURGERY     bone grafting  . DILATION AND CURETTAGE OF UTERUS    . LAPAROSCOPY N/A 11/12/2012   Procedure: LAPAROSCOPY DIAGNOSTIC;  Surgeon: Marylynn Pearson, MD;  Location: [facility];  Service: Gynecology;  Laterality: N/A;  . POLYPECTOMY      There were no vitals filed for this visit.    Subjective Assessment - 02/17/20 0753    Subjective Pt reports she has been having Lt knee pain for about a year It has been getting worse and she now notices it more with ascending stairs and crossing legs to don shoes.  She is trying to get back to her exercise but the squating hurts her.  She is able to walk and perform light jog.    Currently in Pain? No/denies              [facility] PT Assessment - 02/17/20 0001      Assessment   Medical Diagnosis Patellofemoral syndrome Lt     Referring Provider (PT) Dr Jacklyn Shell    Onset Date/Surgical Date 02/17/19    Next MD Visit 03/17/2020    Prior Therapy no      Precautions   Precautions None      Balance Screen   Has the patient fallen in the past 6 months No    Has the patient had a decrease in activity level because of a fear of falling?  No    Is the patient reluctant to leave their home because of a fear of falling?  No      Home Ecologist residence    Home Layout Multi-level      Prior Function   Level of Independence Independent    Vocation Full time employment    Vocation Requirements desk job    Leisure excercise, cleaning      Observation/Other Assessments   Focus on Therapeutic Outcomes (FOTO)  33% limited      Functional Tests   Functional tests Squat;Single leg stance      Squat   Comments shift to Rt, bilat LE ER       Single  Leg Stance   Comments WNL      ROM / Strength   AROM / PROM / Strength AROM;Strength      AROM   AROM Assessment Site Hip;Knee;Ankle      Strength   Strength Assessment Site Hip;Knee;Ankle    Right/Left Hip --   5/5   Right/Left Knee --   5/5   Right/Left Ankle --   5/5     Flexibility   Soft Tissue Assessment /Muscle Length yes    Hamstrings slight tightness bilat     Quadriceps slight tightness bilat    ITB WNL       Palpation   Patella mobility lateral tracking Lt                       Objective measurements completed on examination: See above findings.       [facility] PT Treatment/Exercise - 02/17/20 0001      Exercises   Exercises Knee/Hip      Knee/Hip Exercises: Stretches   Passive Hamstring Stretch Left;60 seconds    Quad Stretch Left;60 seconds      Knee/Hip Exercises: Seated   Other Seated Knee/Hip Exercises 3x10 [place] sit SLR with ER       Knee/Hip Exercises: Sidelying   Hip ABduction Strengthening;3 sets;10 reps;Left      Manual Therapy   Manual Therapy Taping    Kinesiotex  Facilitate Muscle      Kinesiotix   Facilitate Muscle  Lt knee for patellar tracking                   PT Education - 02/17/20 0837    Education Details POC HEP    Person(s) Educated Patient    Methods Explanation;Demonstration;Handout    Comprehension Verbalized understanding;Returned demonstration               PT [place] Term Goals - 02/17/20 0842      PT Aerts TERM GOAL #1   Title I with advanced HEP ( 03/16/2020)    Time 4    Period Weeks    Status New    Target Date 03/16/20      PT [place] TERM GOAL #2   Title perform TaeBo exercise with no more than 2/10 Lt knee pain ( 03/16/2020)    Time 4    Period Weeks    Status New    Target Date 03/16/20      PT [place] TERM GOAL #3   Title improve FOTO =/< 27% limited ( 03/16/2020)    Time 4    Period Weeks    Status New    Target Date 03/16/20      PT [place] TERM GOAL #4   Title I with self taping if appropriate and helpful ( 03/16/2020)    Time 4    Period Weeks    Status New    Target Date 03/16/20      PT [place] TERM GOAL #5   Title ascend stairs with no more than 2/10 Lt knee pain ( 03/16/2020)    Time 4    Period Weeks    Status New    Target Date 03/16/20                  Plan - 02/17/20 2505    Clinical Impression Statement 54 yo female with progressive Lt knee pain over the last year.  She is referred to strengthen VMO and restore her  PLOF.  She has slight tightness in bilat quads and hamstrings, lateral tracking of the patella and pain with step downs and squats.  Overall lower body strength is good with break testing however she demonstrates functional weakness/instability in knee .  She would benefit form PT to improve mechanics of Lt patella to reduce pain.    Personal Factors and Comorbidities Comorbidity 1    Comorbidities HTN    Examination-Activity Limitations Squat;Stairs    Examination-Participation Restrictions Community Activity    Stability/Clinical Decision Making Stable/Uncomplicated     Clinical Decision Making Low    Rehab Potential Excellent    PT Frequency 1x / week    PT Duration 4 weeks    PT Treatment/Interventions Iontophoresis 4mg /ml Dexamethasone;Stair training;Taping;Vasopneumatic Device;Patient/family education;Functional mobility training;Moist Heat;Therapeutic exercise;Cryotherapy;Electrical Stimulation;Manual techniques;Dry needling    PT Next Visit Plan retape if it helped, VMO strengthening - progress HEP to functional tasks    Consulted and Agree with Plan of Care Patient           Patient will benefit from skilled therapeutic intervention in order to improve the following deficits and impairments:  Pain, Improper body mechanics, Decreased strength  Visit Diagnosis: Acute pain of left knee - Plan: PT plan of care cert/re-cert  Other symptoms and signs involving the musculoskeletal system - Plan: PT plan of care cert/re-cert     Problem List Patient Active Problem List   Diagnosis Date Noted  . Hyperglycemia 06/22/2019  . Obesity (BMI 30.0-34.9) 03/02/2015  . Polycythemia 09/27/2014  . Vitamin D deficiency 09/04/2014  . Metabolic syndrome 97/35/3299  . Leukocytosis 09/04/2014  . Endometriosis 09/04/2014  . Gallstones 06/02/2013  . Depression 08/27/2012  . Dysmenorrhea   . HTN (hypertension) 12/19/2011  . Hypercholesterolemia 12/19/2011    Jeral Pinch PT  02/17/2020, 8:55 AM  [facility] [address] [place], [place], [address] Phone: [phone]   Fax:  [phone]  Name: Halyn Flaugher MRN: [id number] Date of Birth: [birth year]

## 2020-02-21 ENCOUNTER — Other Ambulatory Visit: Payer: Self-pay | Admitting: Family Medicine

## 2020-02-21 NOTE — Telephone Encounter (Signed)
Patient is requesting a refill of the following medications: Requested Prescriptions   Pending Prescriptions Disp Refills  . sertraline (ZOLOFT) 50 MG tablet [Pharmacy Med Name: SERTRALINE 50MG  TABLETS] 135 tablet     Sig: TAKE 1 AND 1/2 TABLETS(75 MG) BY MOUTH DAILY    Date of patient request: 02/21/2020 Last office visit: 02/04/2020 Date of last refill: 02/04/2020 Last refill amount: 90 tablets Follow up time period per chart: 03/17/2020

## 2020-02-21 NOTE — Telephone Encounter (Signed)
Requested medication (s) are due for refill today: no  Requested medication (s) are on the active medication list: yes  Last refill:  11/23/2019  Future visit scheduled: yes  Notes to clinic:  overdue for follow up    Requested Prescriptions  Pending Prescriptions Disp Refills   sertraline (ZOLOFT) 50 MG tablet [Pharmacy Med Name: SERTRALINE 50MG  TABLETS] 135 tablet     Sig: TAKE 1 AND 1/2 TABLETS(75 MG) BY MOUTH DAILY      Psychiatry:  Antidepressants - SSRI Failed - 02/21/2020  3:54 AM      Failed - Valid encounter within last 6 months    Recent Outpatient Visits           2 weeks ago Patellofemoral syndrome of left knee   [facility], Ranell Patrick, MD   2 months ago Vitamin D deficiency   [facility], Ines Bloomer, MD   8 months ago Essential hypertension   [facility], Lilia Argue, MD   1 year ago Vitamin D deficiency   [facility], Lilia Argue, MD   1 year ago Essential hypertension   [facility], Lilia Argue, MD       Future Appointments             In 3 weeks Rutherford Guys, MD [facility], [place] - Completed PHQ-2 or PHQ-9 in the last 360 days.

## 2020-02-23 ENCOUNTER — Ambulatory Visit: Payer: BC Managed Care – PPO

## 2020-02-23 ENCOUNTER — Other Ambulatory Visit: Payer: Self-pay

## 2020-02-23 DIAGNOSIS — M25562 Pain in left knee: Secondary | ICD-10-CM

## 2020-02-23 DIAGNOSIS — R29898 Other symptoms and signs involving the musculoskeletal system: Secondary | ICD-10-CM

## 2020-02-24 NOTE — Therapy (Signed)
[place], [place], [address] Phone: [phone]   Fax:  [phone]  Physical Therapy Treatment  Patient Details  Name: Ariana Juarez MRN: [id number] Date of Birth: 1965-08-11 Referring Provider (PT): Dr Jacklyn Shell   Encounter Date: 02/23/2020   PT End of Session - 02/24/20 0655    Visit Number 2    Number of Visits 4    Date for PT Re-Evaluation 04/20/20    Authorization Type BCBS    PT Start Time 1749    PT Stop Time 1837    PT Time Calculation (min) 48 min    Activity Tolerance Patient tolerated treatment well    Behavior During Therapy [facility] for tasks assessed/performed           Past Medical History:  Diagnosis Date  . Allergy   . Anemia   . Depression   . Dysmenorrhea   . Endometriosis   . GERD (gastroesophageal reflux disease)   . Glaucoma   . Hyperlipidemia   . Hypertension   . Shortness of breath    on exertion, ie climbing stairs  . 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 COLONOSCOPY    . DENTAL SURGERY     bone grafting  . DILATION AND CURETTAGE OF UTERUS    . LAPAROSCOPY N/A 11/12/2012   Procedure: LAPAROSCOPY DIAGNOSTIC;  Surgeon: Marylynn Pearson, MD;  Location: [facility];  Service: Gynecology;  Laterality: N/A;  . POLYPECTOMY      There were no vitals filed for this visit.   Subjective Assessment - 02/23/20 1801    Subjective Pt reports she is primarily experiencing L knee pain when dsc/asc steps and squatting. Pt believes the taping has helped.    Currently in Pain? Yes    Pain Score 6     Pain Location Knee    Pain Descriptors / Indicators Aching    Pain Type Chronic pain    Pain Onset More than a month ago    Aggravating Factors  Steps, squating    Pain Relieving Factors Rest                             [facility] Adult PT Treatment/Exercise -  02/24/20 0001      Exercises   Exercises Knee/Hip      Knee/Hip Exercises: Stretches   Passive Hamstring Stretch Left;60 seconds    Passive Hamstring Stretch Limitations 2 sets    Quad Stretch Left;60 seconds    Quad Stretch Limitations 2 sets      Knee/Hip Exercises: Supine   Bridges with [employer] Strengthening;Both;3 sets;10 reps    Straight Leg Raise with External Rotation Strengthening;Left;3 sets;10 reps      Knee/Hip Exercises: Sidelying   Hip ADduction Strengthening;Left;3 sets;10 reps      Manual Therapy   Manual Therapy Taping    Kinesiotex Facilitate Muscle      Kinesiotix   Facilitate Muscle  Lt knee for patellar tracking                   PT Education - 02/24/20 0655    Education Details HEP    Person(s) Educated Patient    Methods Explanation;Demonstration;Tactile cues;Verbal cues;Handout    Comprehension Verbalized understanding;Returned demonstration;Verbal cues required;Tactile cues required;Need further instruction  PT [facility] Term Goals - 02/17/20 0842      PT Mahl TERM GOAL #1   Title I with advanced HEP ( 03/16/2020)    Time 4    Period Weeks    Status New    Target Date 03/16/20      PT Crutcher TERM GOAL #2   Title perform TaeBo exercise with no more than 2/10 Lt knee pain ( 03/16/2020)    Time 4    Period Weeks    Status New    Target Date 03/16/20      PT Estell TERM GOAL #3   Title improve FOTO =/< 27% limited ( 03/16/2020)    Time 4    Period Weeks    Status New    Target Date 03/16/20      PT Dimaio TERM GOAL #4   Title I with self taping if appropriate and helpful ( 03/16/2020)    Time 4    Period Weeks    Status New    Target Date 03/16/20      PT Beahm TERM GOAL #5   Title ascend stairs with no more than 2/10 Lt knee pain ( 03/16/2020)    Time 4    Period Weeks    Status New    Target Date 03/16/20                 Plan - 02/24/20 0656    Clinical Impression Statement Pt appears to be  responding well to PT intervention. With PF taping, pt reported a reduction in pain c dsc steps and squating. Pt reports daily completion of HEP. Verbal cueing was provided re: proper pace for the completion of these exs for greater effectiveness. Pt returned demonstration. PT will continue to beefit from PT to improve flexibility and functional strength of the L LE to reduce pain and maximize functional abilities.    Personal Factors and Comorbidities Comorbidity 1    Comorbidities HTN    Examination-Activity Limitations Squat;Stairs    Examination-Participation Restrictions Community Activity    Stability/Clinical Decision Making Stable/Uncomplicated    Clinical Decision Making Low    Rehab Potential Excellent    PT Frequency 1x / week    PT Duration 4 weeks    PT Treatment/Interventions Iontophoresis 4mg /ml Dexamethasone;Stair training;Taping;Vasopneumatic Device;Patient/family education;Functional mobility training;Moist Heat;Therapeutic exercise;Cryotherapy;Electrical Stimulation;Manual techniques;Dry needling    PT Next Visit Plan Assess taping, progress HEP to functional tasks as indicated    PT Home Exercise Plan [id number]    Consulted and Agree with Plan of Care Patient           Patient will benefit from skilled therapeutic intervention in order to improve the following deficits and impairments:  Pain, Improper body mechanics, Decreased strength  Visit Diagnosis: Acute pain of left knee  Other symptoms and signs involving the musculoskeletal system     Problem List Patient Active Problem List   Diagnosis Date Noted  . Hyperglycemia 06/22/2019  . Obesity (BMI 30.0-34.9) 03/02/2015  . Polycythemia 09/27/2014  . Vitamin D deficiency 09/04/2014  . Metabolic syndrome 61/95/0932  . Leukocytosis 09/04/2014  . Endometriosis 09/04/2014  . Gallstones 06/02/2013  . Depression 08/27/2012  . Dysmenorrhea   . HTN (hypertension) 12/19/2011  . Hypercholesterolemia 12/19/2011     Gar Ponto MS, PT 02/24/20 7:05 AM  [place] [facility] [address] [place], [place], [address] Phone: [phone]   Fax:  [phone]  Name: Jeanae Whitmill MRN: [id number] Date of Birth: 04-28-1966

## 2020-03-01 ENCOUNTER — Ambulatory Visit: Payer: BC Managed Care – PPO | Attending: Family Medicine

## 2020-03-01 ENCOUNTER — Other Ambulatory Visit: Payer: Self-pay

## 2020-03-01 DIAGNOSIS — M25562 Pain in left knee: Secondary | ICD-10-CM | POA: Diagnosis not present

## 2020-03-01 DIAGNOSIS — G8929 Other chronic pain: Secondary | ICD-10-CM | POA: Insufficient documentation

## 2020-03-01 DIAGNOSIS — R29898 Other symptoms and signs involving the musculoskeletal system: Secondary | ICD-10-CM | POA: Diagnosis not present

## 2020-03-01 DIAGNOSIS — M6281 Muscle weakness (generalized): Secondary | ICD-10-CM | POA: Insufficient documentation

## 2020-03-02 NOTE — Therapy (Signed)
[place], [place], [address] Phone: [phone]   Fax:  [phone]  Physical Therapy Treatment  Patient Details  Name: Ariana Juarez MRN: [id number] Date of Birth: 1966-03-12 Referring Provider (PT): Dr Jacklyn Shell   Encounter Date: 03/01/2020   PT End of Session - 03/02/20 0812    Visit Number 3    Number of Visits 4    Date for PT Re-Evaluation 04/20/20    Authorization Type BCBS    PT Start Time 6962    PT Stop Time 9528    PT Time Calculation (min) 41 min    Activity Tolerance Patient tolerated treatment well    Behavior During Therapy [facility] for tasks assessed/performed           Past Medical History:  Diagnosis Date  . Allergy   . Anemia   . Depression   . Dysmenorrhea   . Endometriosis   . GERD (gastroesophageal reflux disease)   . Glaucoma   . Hyperlipidemia   . Hypertension   . Shortness of breath    on exertion, ie climbing stairs  . 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 COLONOSCOPY    . DENTAL SURGERY     bone grafting  . DILATION AND CURETTAGE OF UTERUS    . LAPAROSCOPY N/A 11/12/2012   Procedure: LAPAROSCOPY DIAGNOSTIC;  Surgeon: Marylynn Pearson, MD;  Location: [facility];  Service: Gynecology;  Laterality: N/A;  . POLYPECTOMY      There were no vitals filed for this visit.   Subjective Assessment - 03/02/20 0805    Subjective Pt reports her L knee has been doing better. She has been using the steps at work more without issue except one day she experienced increased pain afterwards    Limitations Walking;Other (comment)   steps, squating   Currently in Pain? Yes    Pain Score 4     Pain Location Knee    Pain Orientation Left;Anterior    Pain Descriptors / Indicators Aching    Pain Type Chronic pain    Pain Onset More than a month ago    Pain Frequency Intermittent     Aggravating Factors  Steps, squating    Pain Relieving Factors Rest    Effect of Pain on Daily Activities Decreases activity when painful                             [facility] Adult PT Treatment/Exercise - 03/02/20 0001      Exercises   Exercises Knee/Hip      Knee/Hip Exercises: Stretches   Passive Hamstring Stretch Left;60 seconds    Passive Hamstring Stretch Limitations 2 sets; seated      Knee/Hip Exercises: Standing   Lateral Step Up Left;10 reps;Hand Hold: 1   Brief pain increased and Dced   Other Standing Knee Exercises Reverse lunge 10 x 3      Knee/Hip Exercises: Seated   Bos Arc Quad Strengthening;Left;10 reps;3 sets    Bebee Arc Quad Limitations c ball squeeze      Knee/Hip Exercises: Supine   Bridges with [employer] Strengthening;Both;10 reps;2 sets    Straight Leg Raise with External Rotation Strengthening;Left;3 sets;10 reps      Knee/Hip Exercises: Sidelying   Hip ABduction Strengthening;Left;3 sets;10 reps  Hip ADduction Strengthening;3 sets;10 reps    Clams Lt; 10x3      Manual Therapy   Manual Therapy Taping    Kinesiotex Facilitate Muscle      Kinesiotix   Facilitate Muscle  Lt knee for patellar tracking                   PT Education - 03/02/20 0811    Education Details HEP progression; Application of kinesiotape    Person(s) Educated Patient    Methods Demonstration;Tactile cues;Explanation;Verbal cues;Handout    Comprehension Verbalized understanding;Returned demonstration;Verbal cues required;Tactile cues required               PT Jaros Term Goals - 02/17/20 0842      PT Linebaugh TERM GOAL #1   Title I with advanced HEP ( 03/16/2020)    Time 4    Period Weeks    Status New    Target Date 03/16/20      PT Arch TERM GOAL #2   Title perform TaeBo exercise with no more than 2/10 Lt knee pain ( 03/16/2020)    Time 4    Period Weeks    Status New    Target Date 03/16/20      PT Eriksson TERM GOAL #3   Title  improve FOTO =/< 27% limited ( 03/16/2020)    Time 4    Period Weeks    Status New    Target Date 03/16/20      PT Mcomber TERM GOAL #4   Title I with self taping if appropriate and helpful ( 03/16/2020)    Time 4    Period Weeks    Status New    Target Date 03/16/20      PT Kramlich TERM GOAL #5   Title ascend stairs with no more than 2/10 Lt knee pain ( 03/16/2020)    Time 4    Period Weeks    Status New    Target Date 03/16/20                 Plan - 03/02/20 0813    Clinical Impression Statement Pt's L knee pain and funcitonal use are improving. CKC exs were initiated with reverse lunges tolerated and lateral steps up tolerated for 10 reps. After 10 reps, pt experienced L knee pain which continued with additional reps and the ex was DCed. Pt's HEP was progressed with additional open chain exs for quad and hip strengthening. Pt will benefit form PT for L quad/LE strengthening to reduce pain and maximize function.    Personal Factors and Comorbidities Comorbidity 1    Comorbidities HTN    Examination-Activity Limitations Squat;Stairs    Examination-Participation Restrictions Community Activity    Stability/Clinical Decision Making Stable/Uncomplicated    Clinical Decision Making Low    Rehab Potential Excellent    PT Frequency 1x / week    PT Duration 4 weeks    PT Treatment/Interventions Iontophoresis 4mg /ml Dexamethasone;Stair training;Taping;Vasopneumatic Device;Patient/family education;Functional mobility training;Moist Heat;Therapeutic exercise;Cryotherapy;Electrical Stimulation;Manual techniques;Dry needling    PT Next Visit Plan Assess taping and pt's undertanding, progress HEP to CKC exs as tolerated    PT Home Exercise Plan [id number]    Consulted and Agree with Plan of Care Patient           Patient will benefit from skilled therapeutic intervention in order to improve the following deficits and impairments:  Pain, Improper body mechanics, Decreased strength  Visit  Diagnosis: Acute pain of left knee  Other symptoms and signs involving the musculoskeletal system     Problem List Patient Active Problem List   Diagnosis Date Noted  . Hyperglycemia 06/22/2019  . Obesity (BMI 30.0-34.9) 03/02/2015  . Polycythemia 09/27/2014  . Vitamin D deficiency 09/04/2014  . Metabolic syndrome 69/24/9324  . Leukocytosis 09/04/2014  . Endometriosis 09/04/2014  . Gallstones 06/02/2013  . Depression 08/27/2012  . Dysmenorrhea   . HTN (hypertension) 12/19/2011  . Hypercholesterolemia 12/19/2011    Gar Ponto MS, PT 03/02/20 8:26 AM  [place] [facility] [address] [place], [place], [address] Phone: [phone]   Fax:  [phone]  Name: Ariana Juarez MRN: [id number] Date of Birth: 13-May-1966

## 2020-03-02 NOTE — Patient Instructions (Signed)
   with ball squeeze for LAQ

## 2020-03-08 ENCOUNTER — Ambulatory Visit: Payer: BC Managed Care – PPO

## 2020-03-08 ENCOUNTER — Other Ambulatory Visit: Payer: Self-pay

## 2020-03-08 DIAGNOSIS — M25562 Pain in left knee: Secondary | ICD-10-CM | POA: Diagnosis not present

## 2020-03-08 DIAGNOSIS — R29898 Other symptoms and signs involving the musculoskeletal system: Secondary | ICD-10-CM | POA: Diagnosis not present

## 2020-03-08 DIAGNOSIS — M6281 Muscle weakness (generalized): Secondary | ICD-10-CM | POA: Diagnosis not present

## 2020-03-08 DIAGNOSIS — G8929 Other chronic pain: Secondary | ICD-10-CM

## 2020-03-09 NOTE — Therapy (Signed)
[place], [place], [address] Phone: [phone]   Fax:  [phone]  Physical Therapy Treatment  Patient Details  Name: Ariana Juarez MRN: [id number] Date of Birth: October 07, 1965 Referring Provider (PT): Dr Jacklyn Shell   Encounter Date: 03/08/2020    Past Medical History:  Diagnosis Date  . Allergy   . Anemia   . Depression   . Dysmenorrhea   . Endometriosis   . GERD (gastroesophageal reflux disease)   . Glaucoma   . Hyperlipidemia   . Hypertension   . Shortness of breath    on exertion, ie climbing stairs  . 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 COLONOSCOPY    . DENTAL SURGERY     bone grafting  . DILATION AND CURETTAGE OF UTERUS    . LAPAROSCOPY N/A 11/12/2012   Procedure: LAPAROSCOPY DIAGNOSTIC;  Surgeon: Marylynn Pearson, MD;  Location: [facility];  Service: Gynecology;  Laterality: N/A;  . POLYPECTOMY      There were no vitals filed for this visit.   Subjective Assessment - 03/08/20 1752    Subjective Pt reports her L knee is doing well. Pt states with daily activities involving minimal squating at home and work she is not having any pain.    Currently in Pain? No/denies    Pain Location Knee    Pain Orientation Right    Pain Descriptors / Indicators Aching    Pain Type Chronic pain    Pain Onset More than a month ago    Pain Frequency Intermittent    Aggravating Factors  Steps, squatting    Pain Relieving Factors RestLimits some activities                             [facility] Adult PT Treatment/Exercise - 03/09/20 0001      Exercises   Exercises Knee/Hip      Knee/Hip Exercises: Standing   Lateral Step Up --   Brief pain not added to HEP   Wall Squat 5 reps    Wall Squat Limitations Brief patellar pain, not addedd to HEP    SLS 10x2   Forward bending, golfer  lift to 12 inch step   Other Standing Knee Exercises Reverse lunge 10 x 3   partial movement   Other Standing Knee Exercises TKE with towel roll 10x2      Knee/Hip Exercises: Seated   Mabry Arc Quad Strengthening;Left;15 reps;2 sets    Orourke Arc Quad Limitations c ball squeeze      Knee/Hip Exercises: Supine   Straight Leg Raise with External Rotation Strengthening;Left;3 sets;10 reps      Knee/Hip Exercises: Sidelying   Hip ABduction Strengthening;Left;3 sets;10 reps    Hip ADduction Strengthening;3 sets;10 reps    Clams Lt; 10x3                  PT Education - 03/09/20 0850    Education Details HEP    Person(s) Educated Patient    Methods Explanation;Demonstration;Tactile cues;Verbal cues    Comprehension Verbalized understanding;Returned demonstration;Verbal cues required;Tactile cues required;Need further instruction               PT [place] Term Goals - 02/17/20 4401      PT Iwan TERM GOAL #1   Title I with advanced  HEP ( 03/16/2020)    Time 4    Period Weeks    Status New    Target Date 03/16/20      PT Trulson TERM GOAL #2   Title perform TaeBo exercise with no more than 2/10 Lt knee pain ( 03/16/2020)    Time 4    Period Weeks    Status New    Target Date 03/16/20      PT Feldstein TERM GOAL #3   Title improve FOTO =/< 27% limited ( 03/16/2020)    Time 4    Period Weeks    Status New    Target Date 03/16/20      PT Adolf TERM GOAL #4   Title I with self taping if appropriate and helpful ( 03/16/2020)    Time 4    Period Weeks    Status New    Target Date 03/16/20      PT Castile TERM GOAL #5   Title ascend stairs with no more than 2/10 Lt knee pain ( 03/16/2020)    Time 4    Period Weeks    Status New    Target Date 03/16/20                 Plan - 03/09/20 0851    Clinical Impression Statement Pt tolerated low level CHC exs of TKE in standing and partial reverse lunges. 1/4 squats reproduced L patellar pain. Overall, with pt's usual daily  activities involving min squating, she is experiencing improved pain.    Personal Factors and Comorbidities Comorbidity 1    Comorbidities HTN    Examination-Activity Limitations Squat;Stairs    Examination-Participation Restrictions Community Activity    Stability/Clinical Decision Making Stable/Uncomplicated    Clinical Decision Making Low    Rehab Potential Excellent    PT Frequency 1x / week    PT Duration 4 weeks    PT Treatment/Interventions Iontophoresis 4mg /ml Dexamethasone;Stair training;Taping;Vasopneumatic Device;Patient/family education;Functional mobility training;Moist Heat;Therapeutic exercise;Cryotherapy;Electrical Stimulation;Manual techniques;Dry needling    PT Next Visit Plan Progress CKC exs as tolerated    PT Home Exercise Plan [id number]    Consulted and Agree with Plan of Care Patient           Patient will benefit from skilled therapeutic intervention in order to improve the following deficits and impairments:  Pain, Improper body mechanics, Decreased strength  Visit Diagnosis: Other symptoms and signs involving the musculoskeletal system  Chronic pain of left knee  Muscle weakness (generalized)     Problem List Patient Active Problem List   Diagnosis Date Noted  . Hyperglycemia 06/22/2019  . Obesity (BMI 30.0-34.9) 03/02/2015  . Polycythemia 09/27/2014  . Vitamin D deficiency 09/04/2014  . Metabolic syndrome 70/07/7492  . Leukocytosis 09/04/2014  . Endometriosis 09/04/2014  . Gallstones 06/02/2013  . Depression 08/27/2012  . Dysmenorrhea   . HTN (hypertension) 12/19/2011  . Hypercholesterolemia 12/19/2011   Gar Ponto MS, PT 03/09/20 9:02 AM  [facility] [address] [place], [place], [address] Phone: [phone]   Fax:  [phone]  Name: Holland Kotter MRN: [id number] Date of Birth: 1966/05/22

## 2020-03-15 ENCOUNTER — Ambulatory Visit: Payer: BC Managed Care – PPO

## 2020-03-15 ENCOUNTER — Other Ambulatory Visit: Payer: Self-pay

## 2020-03-15 DIAGNOSIS — R29898 Other symptoms and signs involving the musculoskeletal system: Secondary | ICD-10-CM | POA: Diagnosis not present

## 2020-03-15 DIAGNOSIS — M25562 Pain in left knee: Secondary | ICD-10-CM | POA: Diagnosis not present

## 2020-03-15 DIAGNOSIS — G8929 Other chronic pain: Secondary | ICD-10-CM

## 2020-03-15 DIAGNOSIS — M6281 Muscle weakness (generalized): Secondary | ICD-10-CM

## 2020-03-16 NOTE — Patient Instructions (Signed)
Pt is to add reverse lunge, golfer's lift, and standing TKE to HEP

## 2020-03-16 NOTE — Therapy (Signed)
Ariana Juarez, [place], [address] Phone: [phone]   Fax:  [phone]  Physical Therapy Treatment  Patient Details  Name: Ariana Juarez MRN: [id number] Date of Birth: 06-04-1966 Referring Provider (PT): Dr Jacklyn Shell   Encounter Date: 03/15/2020   PT End of Session - 03/16/20 0809    Visit Number 5    Number of Visits 9    Date for PT Re-Evaluation 04/28/20    Authorization Type BCBS    PT Start Time 6333    PT Stop Time 1834    PT Time Calculation (min) 47 min    Activity Tolerance Patient tolerated treatment well    Behavior During Therapy [facility] for tasks assessed/performed           Past Medical History:  Diagnosis Date  . Allergy   . Anemia   . Depression   . Dysmenorrhea   . Endometriosis   . GERD (gastroesophageal reflux disease)   . Glaucoma   . Hyperlipidemia   . Hypertension   . Shortness of breath    on exertion, ie climbing stairs  . 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 COLONOSCOPY    . DENTAL SURGERY     bone grafting  . DILATION AND CURETTAGE OF UTERUS    . LAPAROSCOPY N/A 11/12/2012   Procedure: LAPAROSCOPY DIAGNOSTIC;  Surgeon: Marylynn Pearson, MD;  Location: [facility];  Service: Gynecology;  Laterality: N/A;  . POLYPECTOMY      There were no vitals filed for this visit.   Subjective Assessment - 03/15/20 1754    Subjective Pt reports she has not been experiencing L knee pain c daily activities or going up steps. Pt states She favors her R knee going down steps unconsciously and has not tested it. pt does report she completed too many lunges and she experienced thaigh soreness.    Currently in Pain? No/denies    Pain Score 0-No pain    Pain Location Knee    Pain Orientation Left    Pain Descriptors / Indicators Aching    Pain Type Chronic pain    Pain Onset More  than a month ago    Pain Frequency Intermittent    Aggravating Factors  Steps, squatting    Pain Relieving Factors RestLimits some activities    Effect of Pain on Daily Activities Decreases activity when painful              [facility] PT Assessment - 03/16/20 0001      Observation/Other Assessments   Focus on Therapeutic Outcomes (FOTO)  27%                         [facility] Adult PT Treatment/Exercise - 03/16/20 0001      Knee/Hip Exercises: Standing   Lateral Step Up Left;10 reps;1 set;Hand Hold: 1;Step Height: 2"   Pt experience ant.L knee pain at 4" height   SLS 10x2   Forward bending, golfer lift on L LE to 8" platform   Other Standing Knee Exercises Reverse lunge 10 x 2   L LE forward; R LE back to 4" from floor                 PT Education - 03/16/20 0808    Education Details HEP as noted in instructions  Person(s) Educated Patient    Methods Explanation;Demonstration;Tactile cues;Verbal cues    Comprehension Verbalized understanding;Returned demonstration;Verbal cues required;Tactile cues required;Need further instruction               PT Thoennes Term Goals - 03/16/20 0813      PT Greaser TERM GOAL #1   Title I with advanced HEP. On-going    Period Weeks    Status On-going    Target Date 04/28/20      PT Sloniker TERM GOAL #2   Title perform TaeBo exercise with no more than 2/10 Lt knee pain. on-ogoing. P has not attempted    Status On-going    Target Date 04/28/20      PT Roberge TERM GOAL #3   Title improve FOTO =/< 27% limited. Achieved    Baseline 33% limitation    Status Achieved      PT Frankland TERM GOAL #4   Title I with self taping if appropriate and helpful. On-going- Pt has not attempted at home    Status On-going    Target Date 03/15/20      PT Carrozza TERM GOAL #5   Title ascend stairs with no more than 2/10 Lt knee pain. Achieved-Pt ascends steps without L knee pain. Pt has knee pain when dsc steps.    Status Achieved    Target Date  03/15/20      Additional Nettle Term Goals   Additional Montanaro Term Goals Yes      PT Heffelfinger TERM GOAL #6   Title Pt will be able to dsc 15 steps with L knee pain of 2/10 or less.    Status New    Target Date 04/28/20                 Plan - 03/16/20 9735    Clinical Impression Statement To date pt has made appropriate progress in PT with her primarily not experiencing L knee pain with most daily activitiies. She continues to have L anterior knee pain when dsc steps with her L LE as the control leg. 2 of the Tews term goals have been achieved, while 3 are on-going and 1 new goal has been set for the re-cert period. Pt will benefit from continued PT 1w5 to address L LE strength and management techniques to improve pt's L knee pain with higher functioning activities.    Personal Factors and Comorbidities Comorbidity 1    Comorbidities HTN    Examination-Activity Limitations Squat;Stairs    Examination-Participation Restrictions Community Activity    Stability/Clinical Decision Making Stable/Uncomplicated    Clinical Decision Making Low    Rehab Potential Excellent    PT Frequency 1x / week    PT Duration 4 weeks    PT Treatment/Interventions Iontophoresis 4mg /ml Dexamethasone;Stair training;Taping;Vasopneumatic Device;Patient/family education;Functional mobility training;Moist Heat;Therapeutic exercise;Cryotherapy;Electrical Stimulation;Manual techniques;Dry needling    PT Next Visit Plan Assess use of kinesiotape for L knee pain reduction with higher functioning activities    PT Home Exercise Plan [id number]    Consulted and Agree with Plan of Care Patient           Patient will benefit from skilled therapeutic intervention in order to improve the following deficits and impairments:  Pain, Improper body mechanics, Decreased strength  Visit Diagnosis: Other symptoms and signs involving the musculoskeletal system - Plan: PT plan of care cert/re-cert  Chronic pain of left knee - Plan:  PT plan of care cert/re-cert  Muscle weakness (generalized) - Plan: PT plan of  care cert/re-cert  Acute pain of left knee - Plan: PT plan of care cert/re-cert     Problem List Patient Active Problem List   Diagnosis Date Noted  . Hyperglycemia 06/22/2019  . Obesity (BMI 30.0-34.9) 03/02/2015  . Polycythemia 09/27/2014  . Vitamin D deficiency 09/04/2014  . Metabolic syndrome 20/76/1915  . Leukocytosis 09/04/2014  . Endometriosis 09/04/2014  . Gallstones 06/02/2013  . Depression 08/27/2012  . Dysmenorrhea   . HTN (hypertension) 12/19/2011  . Hypercholesterolemia 12/19/2011    Gar Ponto MS, PT 03/16/20 3:33 PM  [place] [facility] [address] [place], [place], [address] Phone: [phone]   Fax:  [phone]  Name: Takaya Hyslop MRN: [id number] Date of Birth: September 06, 1965

## 2020-03-17 ENCOUNTER — Encounter: Payer: Self-pay | Admitting: Family Medicine

## 2020-03-17 ENCOUNTER — Other Ambulatory Visit: Payer: Self-pay

## 2020-03-17 ENCOUNTER — Ambulatory Visit (INDEPENDENT_AMBULATORY_CARE_PROVIDER_SITE_OTHER): Payer: BC Managed Care – PPO | Admitting: Family Medicine

## 2020-03-17 VITALS — BP 128/80 | HR 75 | Temp 98.1°F | Ht 64.0 in | Wt 197.6 lb

## 2020-03-17 DIAGNOSIS — E7800 Pure hypercholesterolemia, unspecified: Secondary | ICD-10-CM

## 2020-03-17 DIAGNOSIS — B372 Candidiasis of skin and nail: Secondary | ICD-10-CM

## 2020-03-17 DIAGNOSIS — M222X2 Patellofemoral disorders, left knee: Secondary | ICD-10-CM | POA: Diagnosis not present

## 2020-03-17 DIAGNOSIS — E559 Vitamin D deficiency, unspecified: Secondary | ICD-10-CM | POA: Diagnosis not present

## 2020-03-17 MED ORDER — NYSTATIN 100000 UNIT/GM EX CREA: 1.0000 "application " | TOPICAL_CREAM | Freq: Two times a day (BID) | CUTANEOUS | 2 refills | Status: AC

## 2020-03-17 NOTE — Patient Instructions (Signed)
° ° ° °  If you have lab work done today you will be contacted with your lab results within the next 2 weeks.  If you have not heard from us then please contact us. The fastest way to get your results is to register for My Chart. ° ° °IF you received an x-ray today, you will receive an invoice from [facility]. Please contact [facility] at [phone] with questions or concerns regarding your invoice.  ° °IF you received labwork today, you will receive an invoice from [facility]. Please contact [facility] at [phone] with questions or concerns regarding your invoice.  ° °Our billing staff will not be able to assist you with questions regarding bills from these companies. ° °You will be contacted with the lab results as soon as they are available. The fastest way to get your results is to activate your My Chart account. Instructions are located on the last page of this paperwork. If you have not heard from us regarding the results in 2 weeks, please contact this office. °  ° ° ° °

## 2020-03-17 NOTE — Progress Notes (Signed)
9/17/20212:59 PM  Ariana Juarez 24-Mar-1966, 54 y.o., female [id number]  Chief Complaint  Patient presents with  . Knee Pain    left knee has gotten better with PT  . Thrush    having break outs under breasts and other areas of the body, using otcs to control    HPI:   Patient is a 54 y.o. female with past medical history significant for HTN, HLP, prediabetes, vitamin D deficiency, leukocytosis, colonic polyps, depression  who presents today for followup on knee and concerns for rash  Seen aug 2021 by Dr Carlota Raspberry - dx patellofemoral syndrome, referred to PT  She is doing much better She still has a bit more pain going up and down stairs Still has sessions She is working on weight, walking and weight watchers She has noticed some intermittent yeast when she exercises  She has been using lady anti-monkey butt She would like to discuss her cholesterol labs Takes red yeast rice daily Increased her vitamin D from 5,000 u to 10,000 units daily  Last vitamin D Lab Results  Component Value Date   VD25OH 35.2 12/20/2019    Lab Results  Component Value Date   CHOL 236 (H) 12/20/2019   HDL 45 12/20/2019   LDLCALC 155 (H) 12/20/2019   TRIG 197 (H) 12/20/2019   CHOLHDL 5.2 (H) 12/20/2019    Depression screen PHQ 2/9 02/04/2020 06/22/2019 11/06/2017  Decreased Interest 0 0 0  Down, Depressed, Hopeless 0 0 0  PHQ - 2 Score 0 0 0    Fall Risk  02/04/2020 06/22/2019 11/06/2017 01/10/2017 12/26/2015  Falls in the past year? 0 0 No No No  Number falls in past yr: 0 - - - -  Injury with Fall? 0 - - - -  Follow up Falls evaluation completed - - - -     Allergies  Allergen Reactions  . Mobic [Meloxicam]     Patient states she "felt her heart beat" while on med    Prior to Admission medications   Medication Sig Start Date End Date Taking? Authorizing Provider  Ascorbic Acid (VITAMIN C) 1000 MG tablet Take 1,000 mg by mouth daily.   Yes [provider]  ASHWAGANDHA PO Take by  mouth.   Yes [provider]  Beta Carotene (VITAMIN A) 25000 UNIT capsule Take 25,000 Units by mouth daily.   Yes [provider]  BLACK ELDERBERRY PO Take by mouth. Homemade syrup daily   Yes [provider]  CALCIUM MAGNESIUM 750 PO Take by mouth.   Yes [provider]  cholecalciferol (VITAMIN D3) 25 MCG (1000 UT) tablet Take 1,000 Units by mouth daily.   Yes [provider]  CHROMIUM PICOLINATE PO Take by mouth.   Yes [provider]  GARLIC PO Take by mouth.   Yes [provider]  lisinopril (ZESTRIL) 20 MG tablet TAKE 1 TABLET(20 MG) BY MOUTH DAILY 02/04/20  Yes Wendie Agreste, MD  Nutritional Supplements (DHEA PO) Take by mouth.   Yes [provider]  OIL OF OREGANO PO Take by mouth.   Yes [provider]  OLIVE LEAF PO Take by mouth.   Yes [provider]  OVER THE COUNTER MEDICATION Tumeric  500 mg taking daily   Yes [provider]  OVER THE COUNTER MEDICATION Methylation Complete taking daily   Yes [provider]  OVER THE COUNTER MEDICATION ASEA daily   Yes [provider]  Pregnenolone Micronized 50 MG TABS Take  by mouth.   Yes [provider]  Probiotic Product (ADVANCED PROBIOTIC 10 PO) Take by mouth.   Yes [provider]  Red Yeast Rice Extract (RED YEAST RICE PO) Take by mouth.   Yes [provider]  sertraline (ZOLOFT) 50 MG tablet TAKE 1 AND 1/2 TABLETS(75 MG) BY MOUTH DAILY 02/21/20  Yes Rutherford Guys, MD  Specialty Vitamins Products (BIOTIN PLUS KERATIN) 10000-100 MCG-MG TABS Take by mouth daily.   Yes [provider]  triamterene-hydrochlorothiazide (MAXZIDE-25) 37.5-25 MG tablet Take 1 tablet by mouth daily. 02/04/20  Yes Wendie Agreste, MD  vitamin E 100 UNIT capsule Take by mouth daily.   Yes [provider]  vitamin k 100 MCG tablet Take 100 mcg by mouth daily.   Yes [provider]  zinc  gluconate 50 MG tablet Take 50 mg by mouth daily.   Yes [provider]    Past Medical History:  Diagnosis Date  . Allergy   . Anemia   . Depression   . Dysmenorrhea   . Endometriosis   . GERD (gastroesophageal reflux disease)   . Glaucoma   . Hyperlipidemia   . Hypertension   . Shortness of breath    on exertion, ie climbing stairs  . 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 COLONOSCOPY    . DENTAL SURGERY     bone grafting  . DILATION AND CURETTAGE OF UTERUS    . LAPAROSCOPY N/A 11/12/2012   Procedure: LAPAROSCOPY DIAGNOSTIC;  Surgeon: Marylynn Pearson, MD;  Location: [facility];  Service: Gynecology;  Laterality: N/A;  . POLYPECTOMY      Social History   Tobacco Use  . Smoking status: Never Smoker  . Smokeless tobacco: Never Used  Substance Use Topics  . Alcohol use: Yes    Comment: one drink every 2 years    Family History  Problem Relation Age of Onset  . Cancer Mother 37       uterine  . Uterine cancer Mother   . Cancer Father        lung/liver  . Spina bifida Brother        mild  . Crohn's disease Brother 51  . Colon cancer Neg Hx   . Colon polyps Neg Hx   . Esophageal cancer Neg Hx   . Rectal cancer Neg Hx   . Stomach cancer Neg Hx     ROS Per hpi  OBJECTIVE:  Today's Vitals   03/17/20 1446  BP: 128/80  Pulse: 75  Temp: 98.1 F (36.7 C)  SpO2: 98%  Weight: 197 lb 9.6 oz (89.6 kg)  Height: 5\' 4"  (1.626 m)   Body mass index is 33.92 kg/m.   Physical Exam Vitals and nursing note reviewed.  Constitutional:      Appearance: She is well-developed.  HENT:     Head: Normocephalic and atraumatic.  Eyes:     General: No scleral icterus.    Conjunctiva/sclera: Conjunctivae normal.     Pupils: Pupils are equal, round, and reactive to light.  Pulmonary:     Effort: Pulmonary effort is normal.  Musculoskeletal:      Cervical back: Neck supple.  Skin:    General: Skin is warm and dry.  Neurological:     Mental Status: She is alert and oriented to person, place, and time.  No results found for this or any previous visit (from the past 24 hour(s)).  No results found.   ASSESSMENT and PLAN  1. Patellofemoral syndrome of left knee Improving. Cont with PT  2. Hypercholesterolemia Discussed LFM and supplements.   3. Vitamin D deficiency Discussed desired range of vitamin D. Decrease to 5000 units M-F and 10000u Sat-Sun  4. Candidiasis, intertrigo Discussed supportive measures, rx nystatin given to use if flare up  Other orders - nystatin cream (MYCOSTATIN); Apply 1 application topically 2 (two) times daily.  Return in about 3 months (around 06/16/2020).    Rutherford Guys, MD [facility] [place], [place] [address] Ph.  [phone] Fax [phone]

## 2020-03-24 ENCOUNTER — Encounter: Payer: Self-pay | Admitting: *Deleted

## 2020-03-28 ENCOUNTER — Other Ambulatory Visit: Payer: Self-pay

## 2020-03-28 ENCOUNTER — Ambulatory Visit: Payer: BC Managed Care – PPO

## 2020-03-28 DIAGNOSIS — R29898 Other symptoms and signs involving the musculoskeletal system: Secondary | ICD-10-CM

## 2020-03-28 DIAGNOSIS — M25562 Pain in left knee: Secondary | ICD-10-CM | POA: Diagnosis not present

## 2020-03-28 DIAGNOSIS — M6281 Muscle weakness (generalized): Secondary | ICD-10-CM

## 2020-03-28 DIAGNOSIS — G8929 Other chronic pain: Secondary | ICD-10-CM

## 2020-03-28 NOTE — Therapy (Addendum)
[place], [place], [address] Phone: [phone]   Fax:  [phone]  Physical Therapy Treatment/Discharge  Patient Details  Name: Ariana Juarez MRN: [id number] Date of Birth: 05-Feb-1966 Referring Provider (PT): Dr Jacklyn Shell   Encounter Date: 03/28/2020   PT End of Session - 03/28/20 0937    Visit Number 6    Number of Visits 9    Date for PT Re-Evaluation 04/28/20    Authorization Type BCBS    PT Start Time [phone]    PT Stop Time 1000    PT Time Calculation (min) 44 min    Activity Tolerance Patient tolerated treatment well    Behavior During Therapy [facility] for tasks assessed/performed           Past Medical History:  Diagnosis Date  . Allergy   . Anemia   . Depression   . Dysmenorrhea   . Endometriosis   . GERD (gastroesophageal reflux disease)   . Glaucoma   . Hyperlipidemia   . Hypertension   . Shortness of breath    on exertion, ie climbing stairs  . 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 COLONOSCOPY    . DENTAL SURGERY     bone grafting  . DILATION AND CURETTAGE OF UTERUS    . LAPAROSCOPY N/A 11/12/2012   Procedure: LAPAROSCOPY DIAGNOSTIC;  Surgeon: Marylynn Pearson, MD;  Location: [facility];  Service: Gynecology;  Laterality: N/A;  . POLYPECTOMY      There were no vitals filed for this visit.   Subjective Assessment - 03/28/20 0953    Subjective Pt reports she has been consistent with her HEP. Pt states is primarily pain free with her daily activities and steps. pt is pleased with her progress, but is going to delay returning to [employer], and replace with walking.    Limitations Walking;Other (comment)    Currently in Pain? Yes   2/10, every now and then   Pain Score 0-No pain    Pain Location Knee    Pain Orientation Left    Pain Descriptors / Indicators Aching    Pain Type  Chronic pain    Pain Onset More than a month ago    Pain Frequency Other (Comment)   every now and then   Aggravating Factors  Steps, squatting    Pain Relieving Factors Rest Limits some activities    Effect of Pain on Daily Activities Minimal impact                             [facility] Adult PT Treatment/Exercise - 03/28/20 0001      Exercises   Exercises Knee/Hip      Knee/Hip Exercises: Stretches   Passive Hamstring Stretch Left;30 seconds    Passive Hamstring Stretch Limitations 2 sets; seated      Knee/Hip Exercises: Standing   Wall Squat 5 reps;3 seconds    Wall Squat Limitations 3 sets    SLS 10x3   Forward bending, golfer lift on L LE to 8" platform   Other Standing Knee Exercises Reverse lunge 10 x 2   L LE forward; R LE back to 4" from floor   Other Standing Knee Exercises TKE with ball 10x2      Knee/Hip Exercises: [place]  Strengthening;Left;15 reps;2 sets    Tews Arc Quad Limitations c ball squeeze      Knee/Hip Exercises: Supine   Straight Leg Raise with External Rotation Strengthening;Left;15 reps      Knee/Hip Exercises: Sidelying   Hip ABduction Strengthening;Left;2 sets;15 reps    Hip ADduction Strengthening;3 sets;10 reps    Clams Lt; 15x2                       PT Schlitt Term Goals - 03/16/20 0813      PT Perry TERM GOAL #1   Title I with advanced HEP. On-going    Period Weeks    Status On-going    Target Date 04/28/20      PT Juday TERM GOAL #2   Title perform TaeBo exercise with no more than 2/10 Lt knee pain. on-ogoing. P has not attempted    Status On-going    Target Date 04/28/20      PT Voth TERM GOAL #3   Title improve FOTO =/< 27% limited. Achieved    Baseline 33% limitation    Status Achieved      PT Breeden TERM GOAL #4   Title I with self taping if appropriate and helpful. On-going- Pt has not attempted at home    Status On-going    Target Date 03/15/20      PT Rozario TERM GOAL #5   Title  ascend stairs with no more than 2/10 Lt knee pain. Achieved-Pt ascends steps without L knee pain. Pt has knee pain when dsc steps.    Status Achieved    Target Date 03/15/20      Additional Zoeller Term Goals   Additional Smeal Term Goals Yes      PT Blaisdell TERM GOAL #6   Title Pt will be able to dsc 15 steps with L knee pain of 2/10 or less.    Status New    Target Date 04/28/20                 Plan - 03/28/20 1328    Clinical Impression Statement Pt returns to PT after 2 weeks. pt reports she has been consistent with her HEP and continued improvement in her knee pain. Pt is tolerating the addition of CKC exs for reverse lunges and SL hinges to her HEP. Will re-assess lateral step ups the next PT session..    Personal Factors and Comorbidities Comorbidity 1    Comorbidities HTN    Examination-Activity Limitations Squat;Stairs    Examination-Participation Restrictions Community Activity    Stability/Clinical Decision Making Stable/Uncomplicated    Clinical Decision Making Low    Rehab Potential Excellent    PT Frequency 1x / week    PT Duration 4 weeks    PT Treatment/Interventions Iontophoresis 4mg /ml Dexamethasone;Stair training;Taping;Vasopneumatic Device;Patient/family education;Functional mobility training;Moist Heat;Therapeutic exercise;Cryotherapy;Electrical Stimulation;Manual techniques;Dry needling    PT Next Visit Plan Assess ability to complete laterl step ups. Anticpate Dc the next PT session. if pt's condiiton continues to improve.    PT Home Exercise Plan [id number]    Consulted and Agree with Plan of Care Patient           Patient will benefit from skilled therapeutic intervention in order to improve the following deficits and impairments:  Pain, Improper body mechanics, Decreased strength  Visit Diagnosis: Other symptoms and signs involving the musculoskeletal system  Chronic pain of left knee  Muscle weakness (generalized)  Acute pain of left  knee  Problem List Patient Active Problem List   Diagnosis Date Noted  . Hyperglycemia 06/22/2019  . Obesity (BMI 30.0-34.9) 03/02/2015  . Polycythemia 09/27/2014  . Vitamin D deficiency 09/04/2014  . Metabolic syndrome 76/22/6333  . Leukocytosis 09/04/2014  . Endometriosis 09/04/2014  . Gallstones 06/02/2013  . Depression 08/27/2012  . Dysmenorrhea   . HTN (hypertension) 12/19/2011  . Hypercholesterolemia 12/19/2011    Gar Ponto MS, PT 03/28/20 1:40 PM  [facility] [address] [place], [place], [address] Phone: [phone]   Fax:  [phone]  Name: Ariana Juarez MRN: [id number] Date of Birth: 09/09/1965   PHYSICAL THERAPY DISCHARGE SUMMARY  Visits from Start of Care: 6  Current functional level related to goals / functional outcomes: See above   Remaining deficits: Unknown, pt stopped attending PT   Education / Equipment: HEP Plan: Patient agrees to discharge.  Patient goals were partially met. Patient is being discharged due to not returning since the last visit.  ?????        Kathryn Cosby MS, PT 09/05/20 6:46 AM

## 2020-04-12 ENCOUNTER — Ambulatory Visit: Payer: BC Managed Care – PPO

## 2020-05-01 ENCOUNTER — Other Ambulatory Visit: Payer: Self-pay | Admitting: Family Medicine

## 2020-06-12 ENCOUNTER — Encounter: Payer: Self-pay | Admitting: Registered Nurse

## 2020-06-12 ENCOUNTER — Other Ambulatory Visit: Payer: Self-pay

## 2020-06-12 ENCOUNTER — Ambulatory Visit (INDEPENDENT_AMBULATORY_CARE_PROVIDER_SITE_OTHER): Payer: BC Managed Care – PPO | Admitting: Registered Nurse

## 2020-06-12 VITALS — BP 116/73 | HR 73 | Temp 97.7°F | Resp 18 | Ht 64.0 in | Wt 184.2 lb

## 2020-06-12 DIAGNOSIS — I10 Essential (primary) hypertension: Secondary | ICD-10-CM | POA: Diagnosis not present

## 2020-06-12 MED ORDER — TRIAMTERENE-HCTZ 37.5-25 MG PO TABS: 1.0000 | ORAL_TABLET | Freq: Every day | ORAL | 1 refills | Status: DC

## 2020-06-12 NOTE — Patient Instructions (Signed)
° ° ° °  If you have lab work done today you will be contacted with your lab results within the next 2 weeks.  If you have not heard from us then please contact us. The fastest way to get your results is to register for My Chart. ° ° °IF you received an x-ray today, you will receive an invoice from [facility]. Please contact  Radiology at [phone] with questions or concerns regarding your invoice.  ° °IF you received labwork today, you will receive an invoice from [facility]. Please contact [facility] at [phone] with questions or concerns regarding your invoice.  ° °Our billing staff will not be able to assist you with questions regarding bills from these companies. ° °You will be contacted with the lab results as soon as they are available. The fastest way to get your results is to activate your My Chart account. Instructions are located on the last page of this paperwork. If you have not heard from us regarding the results in 2 weeks, please contact this office. °  ° ° ° °

## 2020-06-12 NOTE — Progress Notes (Signed)
Established Patient Office Visit  Subjective:  Patient ID: Ariana Juarez, female    DOB: 1965/10/24  Age: 54 y.o. MRN: [id number]  CC:  Chief Complaint  Patient presents with   Medication Refill    Patient states she is here for a medication refill. Per patient she has no other questions or concerns.    HPI Ariana Juarez presents for medication refill  Hypertension: Patient Currently taking: triamterene-hctz 37.5-25mg PO qd and lisinopril 54m PO qd Good effect. No AEs. Denies CV symptoms including: chest pain, shob, doe, headache, visual changes, fatigue, claudication, and dependent edema.   Previous readings and labs: BP Readings from Last 3 Encounters:  06/12/20 116/73  03/17/20 128/80  02/04/20 116/76   Lab Results  Component Value Date   CREATININE 0.76 12/20/2019   No further concerns Has made positive lifestyle changes that have resulted in intentional weight loss   Past Medical History:  Diagnosis Date   Allergy    Anemia    Depression    Dysmenorrhea    Endometriosis    GERD (gastroesophageal reflux disease)    Glaucoma    Hyperlipidemia    Hypertension    Shortness of breath    on exertion, ie climbing stairs   Uterus and fallopian tubes present    failed removal of fallopian tube; 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    Past Surgical History:  Procedure Laterality Date   COLONOSCOPY     DENTAL SURGERY     bone grafting   DILATION AND CURETTAGE OF UTERUS     LAPAROSCOPY N/A 11/12/2012   Procedure: LAPAROSCOPY DIAGNOSTIC;  Surgeon: GMarylynn Pearson MD;  Location: [facility];  Service: Gynecology;  Laterality: N/A;   POLYPECTOMY      Family History  Problem Relation Age of Onset   Cancer Mother 574      uterine   Uterine cancer Mother    Cancer Father        lung/liver   Spina bifida Brother        mild   Crohn's disease Brother 147  Colon cancer Neg Hx    Colon polyps Neg Hx    Esophageal cancer Neg Hx    Rectal cancer Neg Hx     Stomach cancer Neg Hx     Social History   Socioeconomic History   Marital status: Divorced    Spouse name: n/a   Number of children: 0   Years of education: 12   Highest education level: Not on file  Occupational History   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   Social Determinants of Health   Financial Resource Strain: Not on file  Food Insecurity: Not on file  Transportation Needs: Not on file  Physical Activity: Not on file  Stress: Not on file  Social Connections: Not on file  Intimate Partner Violence: Not on file    Outpatient Medications Prior to Visit  Medication Sig Dispense Refill   Ascorbic Acid (VITAMIN C) 1000 MG tablet Take 1,000 mg by mouth daily.  ASHWAGANDHA PO Take by mouth.     Beta Carotene (VITAMIN A) 25000 UNIT capsule Take 25,000 Units by mouth daily.     BLACK ELDERBERRY PO Take by mouth. Homemade syrup daily     CALCIUM MAGNESIUM 750 PO Take by mouth.     cholecalciferol (VITAMIN D3) 25 MCG (1000 UT) tablet Take 1,000 Units by mouth daily.     CHROMIUM PICOLINATE PO Take by mouth.     GARLIC PO Take by mouth.     lisinopril (ZESTRIL) 20 MG tablet TAKE 1 TABLET(20 MG) BY MOUTH DAILY 90 tablet 0   Nutritional Supplements (DHEA PO) Take by mouth.     OIL OF OREGANO PO Take by mouth.     OLIVE LEAF PO Take by mouth.     OVER THE COUNTER MEDICATION Tumeric  500 mg taking daily     OVER THE COUNTER MEDICATION Methylation Complete taking daily     OVER THE COUNTER MEDICATION ASEA daily     Pregnenolone Micronized 50 MG TABS Take by mouth.      Probiotic Product (ADVANCED PROBIOTIC 10 PO) Take by mouth.     Red Yeast Rice Extract (RED YEAST RICE PO) Take by mouth.     sertraline (ZOLOFT) 50 MG tablet TAKE 1 AND 1/2 TABLETS(75 MG) BY MOUTH DAILY 135 tablet 0   Specialty Vitamins Products (BIOTIN PLUS KERATIN) 10000-100 MCG-MG TABS Take by mouth daily.     vitamin E 100 UNIT capsule Take by mouth daily.     vitamin k 100 MCG tablet Take 100 mcg by mouth daily.     zinc gluconate 50 MG tablet Take 50 mg by mouth daily.     triamterene-hydrochlorothiazide (MAXZIDE-25) 37.5-25 MG tablet Take 1 tablet by mouth daily. 90 tablet 0   nystatin cream (MYCOSTATIN) Apply 1 application topically 2 (two) times daily. (Patient not taking: Reported on 06/12/2020) 30 g 2   No facility-administered medications prior to visit.    Allergies  Allergen Reactions   Mobic [Meloxicam]     Patient states she "felt her heart beat" while on med    ROS Review of Systems  Constitutional: Negative.   HENT: Negative.   Eyes: Negative.   Respiratory: Negative.   Cardiovascular: Negative.   Gastrointestinal: Negative.   Genitourinary: Negative.   Musculoskeletal: Negative.   Skin: Negative.   Neurological: Negative.   Psychiatric/Behavioral: Negative.   All other systems reviewed and are negative.     Objective:    Physical Exam Vitals and nursing note reviewed.  Constitutional:      General: She is not in acute distress.    Appearance: Normal appearance. She is normal weight. She is not ill-appearing, toxic-appearing or diaphoretic.  Cardiovascular:     Rate and Rhythm: Normal rate and regular rhythm.     Heart sounds: Normal heart sounds. No murmur heard. No friction rub. No gallop.   Pulmonary:     Effort: Pulmonary effort is normal. No respiratory distress.     Breath sounds: Normal breath sounds. No stridor. No wheezing, rhonchi or rales.  Chest:     Chest wall: No tenderness.  Skin:    General: Skin is warm and dry.   Neurological:     General: No focal deficit present.     Mental Status: She is alert and oriented to person, place, and time. Mental status is at baseline.  Psychiatric:        Mood and Affect: Mood normal.  Behavior: Behavior normal.        Thought Content: Thought content normal.        Judgment: Judgment normal.     BP 116/73    Pulse 73    Temp 97.7 F (36.5 C) (Temporal)    Resp 18    Ht _0  (1.626 m)    Wt 184 lb 3.2 oz (83.6 kg)    SpO2 99%    BMI 31.62 kg/m  Wt Readings from Last 3 Encounters:  06/12/20 184 lb 3.2 oz (83.6 kg)  03/17/20 197 lb 9.6 oz (89.6 kg)  02/04/20 199 lb 3.2 oz (90.4 kg)     There are no preventive care reminders to display for this patient.  There are no preventive care reminders to display for this patient.  Lab Results  Component Value Date   TSH 1.890 12/20/2019   Lab Results  Component Value Date   WBC 10.9 (H) 01/04/2019   HGB 15.3 01/04/2019   HCT 46.6 01/04/2019   MCV 86 01/04/2019   PLT 316 01/04/2019   Lab Results  Component Value Date   NA 141 12/20/2019   K 4.4 12/20/2019   CHLORIDE 104 09/27/2014   CO2 22 12/20/2019   GLUCOSE 95 12/20/2019   BUN 15 12/20/2019   CREATININE 0.76 12/20/2019   BILITOT 0.4 12/20/2019   ALKPHOS 66 12/20/2019   AST 14 12/20/2019   ALT 17 12/20/2019   PROT 6.7 12/20/2019   ALBUMIN 4.3 12/20/2019   CALCIUM 9.7 12/20/2019   ANIONGAP 15 (H) 09/27/2014   EGFR 85 (L) 09/27/2014   Lab Results  Component Value Date   CHOL 236 (H) 12/20/2019   Lab Results  Component Value Date   HDL 45 12/20/2019   Lab Results  Component Value Date   LDLCALC 155 (H) 12/20/2019   Lab Results  Component Value Date   TRIG 197 (H) 12/20/2019   Lab Results  Component Value Date   CHOLHDL 5.2 (H) 12/20/2019   Lab Results  Component Value Date   HGBA1C 5.5 12/20/2019      Assessment & Plan:   Problem List Items Addressed This Visit   None   Visit Diagnoses    Essential hypertension     -  Primary   Relevant Medications   triamterene-hydrochlorothiazide (MAXZIDE-25) 37.5-25 MG tablet      Meds ordered this encounter  Medications   triamterene-hydrochlorothiazide (MAXZIDE-25) 37.5-25 MG tablet    Sig: Take 1 tablet by mouth daily.    Dispense:  90 tablet    Refill:  1    Order Specific Question:   Supervising Provider    Answer:   Carlota Raspberry, JEFFREY R [[id number]]    Follow-up: No follow-ups on file.   PLAN  Continue current regimen  Follow up at [facility] visit  Return sooner as needed  Patient encouraged to call clinic with any questions, comments, or concerns.  Maximiano Coss, NP

## 2020-07-29 ENCOUNTER — Other Ambulatory Visit: Payer: Self-pay | Admitting: Family Medicine

## 2020-07-29 NOTE — Telephone Encounter (Signed)
Requested Prescriptions  Pending Prescriptions Disp Refills  . lisinopril (ZESTRIL) 20 MG tablet [Pharmacy Med Name: LISINOPRIL 20MG  TABLETS] 90 tablet 0    Sig: TAKE 1 TABLET(20 MG) BY MOUTH DAILY     Cardiovascular:  ACE Inhibitors Failed - 07/29/2020  7:06 AM      Failed - Cr in normal range and within 180 days    Creatinine  Date Value Ref Range Status  09/27/2014 0.8 0.6 - 1.1 mg/dL Final   Creat  Date Value Ref Range Status  12/26/2015 0.88 0.50 - 1.05 mg/dL Final    Comment:      For patients > or = 57 years of age: The upper reference limit for Creatinine is approximately 13% higher for people identified as African-American.      Creatinine, Ser  Date Value Ref Range Status  12/20/2019 0.76 0.57 - 1.00 mg/dL Final         Failed - K in normal range and within 180 days    Potassium  Date Value Ref Range Status  12/20/2019 4.4 3.5 - 5.2 mmol/L Final  09/27/2014 3.4 (L) 3.5 - 5.1 mEq/L Final         Passed - Patient is not pregnant      Passed - Last BP in normal range    BP Readings from Last 1 Encounters:  06/12/20 116/73         Passed - Valid encounter within last 6 months    Recent Outpatient Visits          1 month ago Essential hypertension   [facility], Delfino Lovett, NP   4 months ago Patellofemoral syndrome of left knee   Primary Care at [facility], Lilia Argue, MD   5 months ago Patellofemoral syndrome of left knee   [facility] at [place], MD   7 months ago Vitamin D deficiency   Primary Care at [facility], Ines Bloomer, MD   1 year ago Essential hypertension   Primary Care at [facility], Lilia Argue, MD      Future Appointments            In 1 week Maximiano Coss, NP Primary Care at [place], [facility]

## 2020-08-07 ENCOUNTER — Encounter: Payer: Self-pay | Admitting: Registered Nurse

## 2020-08-07 ENCOUNTER — Ambulatory Visit (INDEPENDENT_AMBULATORY_CARE_PROVIDER_SITE_OTHER): Payer: BC Managed Care – PPO | Admitting: Registered Nurse

## 2020-08-07 ENCOUNTER — Other Ambulatory Visit: Payer: Self-pay

## 2020-08-07 VITALS — BP 145/77 | HR 83 | Temp 98.0°F | Resp 18 | Ht 64.0 in | Wt 178.4 lb

## 2020-08-07 DIAGNOSIS — F411 Generalized anxiety disorder: Secondary | ICD-10-CM

## 2020-08-07 MED ORDER — HYDROXYZINE HCL 10 MG PO TABS: 10.0000 mg | ORAL_TABLET | Freq: Three times a day (TID) | ORAL | 0 refills | Status: DC | PRN

## 2020-08-07 NOTE — Progress Notes (Signed)
Established Patient Office Visit  Subjective:  Patient ID: Ariana Juarez, female    DOB: 25-May-1966  Age: 55 y.o. MRN: [id number]  CC:  Chief Complaint  Patient presents with  . Transitions Of Care    Patient states she is here for TOC. Per patient she would like to discuss medication for her OCD.    HPI Ariana Juarez presents for TOC  Histories reviewed, updated with patient  Concerned for worsening OCD today Has been on sertraline $RemoveBefor'50mg'IsmVrpZbFpxB$  PO qd  Has been pulling out hair a lot lately, particularly when hands are idle. No sleep disturbance Has been trying to distract herself Has not seen psychiatry and is not willing to at this time.  Otherwise feeling well and without further concern.  Past Medical History:  Diagnosis Date  . Allergy   . Anemia   . Depression   . Dysmenorrhea   . Endometriosis   . GERD (gastroesophageal reflux disease)   . Glaucoma   . Hyperlipidemia   . Hypertension   . Shortness of breath    on exertion, ie climbing stairs  . 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 COLONOSCOPY    . DENTAL SURGERY     bone grafting  . DILATION AND CURETTAGE OF UTERUS    . LAPAROSCOPY N/A 11/12/2012   Procedure: LAPAROSCOPY DIAGNOSTIC;  Surgeon: Marylynn Pearson, MD;  Location: [facility];  Service: Gynecology;  Laterality: N/A;  . POLYPECTOMY      Family History  Problem Relation Age of Onset  . Uterine cancer Mother 87  . Lung cancer Father        smoker  . Liver cancer Father        heavy drinker  . Spina bifida Brother        mild  . Crohn's disease Brother 63  . Colon cancer Neg Hx   . Colon polyps Neg Hx   . Esophageal cancer Neg Hx   . Rectal cancer Neg Hx   . Stomach cancer Neg Hx     Social History   Socioeconomic History  . Marital status: Divorced    Spouse name: n/a  . Number of children: 0  . Years of education: 43  . Highest  education level: Not on file  Occupational History  . Occupation: Runner, broadcasting/film/video    Employer: [employer]  Tobacco Use  . Smoking status: Never Smoker  . Smokeless tobacco: Never Used  Vaping Use  . Vaping Use: Never used  Substance and Sexual Activity  . Alcohol use: Yes    Comment: one drink every 2 years  . Drug use: No  . Sexual activity: Not Currently    Partners: Male  Other Topics Concern  . Not on file  Social History Narrative   Lives with her mother and brother, and her maternal uncle.   Social Determinants of Health   Financial Resource Strain: Not on file  Food Insecurity: Not on file  Transportation Needs: Not on file  Physical Activity: Not on file  Stress: Not on file  Social Connections: Not on file  Intimate Partner Violence: Not on file    Outpatient Medications Prior to Visit  Medication Sig Dispense Refill  . Ascorbic Acid (VITAMIN C) 1000 MG tablet Take 1,000 mg by mouth daily.    . ASHWAGANDHA PO Take by mouth.    Ernst Spell  Carotene (VITAMIN A) 25000 UNIT capsule Take 25,000 Units by mouth daily.    Marland Kitchen BLACK ELDERBERRY PO Take by mouth. Homemade syrup daily    . CALCIUM MAGNESIUM 750 PO Take by mouth.    . cholecalciferol (VITAMIN D3) 25 MCG (1000 UT) tablet Take 1,000 Units by mouth daily.    . CHROMIUM PICOLINATE PO Take by mouth.    Marland Kitchen GARLIC PO Take by mouth.    Marland Kitchen lisinopril (ZESTRIL) 20 MG tablet TAKE 1 TABLET(20 MG) BY MOUTH DAILY 90 tablet 0  . Nutritional Supplements (DHEA PO) Take by mouth.    . nystatin cream (MYCOSTATIN) Apply 1 application topically 2 (two) times daily. 30 g 2  . OIL OF OREGANO PO Take by mouth.    . OLIVE LEAF PO Take by mouth.    Marland Kitchen OVER THE COUNTER MEDICATION Tumeric  500 mg taking daily    . OVER THE COUNTER MEDICATION Methylation Complete taking daily    . OVER THE COUNTER MEDICATION ASEA daily    . Pregnenolone Micronized 50 MG TABS Take by mouth.    . Red Yeast Rice Extract (RED YEAST RICE PO) Take by mouth.    .  sertraline (ZOLOFT) 50 MG tablet TAKE 1 AND 1/2 TABLETS(75 MG) BY MOUTH DAILY 135 tablet 0  . triamterene-hydrochlorothiazide (MAXZIDE-25) 37.5-25 MG tablet Take 1 tablet by mouth daily. 90 tablet 1  . vitamin E 100 UNIT capsule Take by mouth daily.    . vitamin k 100 MCG tablet Take 100 mcg by mouth daily.    Marland Kitchen zinc gluconate 50 MG tablet Take 50 mg by mouth daily.    . Probiotic Product (ADVANCED PROBIOTIC 10 PO) Take by mouth. (Patient not taking: Reported on 08/07/2020)    . Specialty Vitamins Products (BIOTIN PLUS KERATIN) 10000-100 MCG-MG TABS Take by mouth daily. (Patient not taking: Reported on 08/07/2020)     No facility-administered medications prior to visit.    Allergies  Allergen Reactions  . Mobic [Meloxicam]     Patient states she "felt her heart beat" while on med    ROS Review of Systems  Constitutional: Negative.   HENT: Negative.   Eyes: Negative.   Respiratory: Negative.   Cardiovascular: Negative.   Gastrointestinal: Negative.   Genitourinary: Negative.   Musculoskeletal: Negative.   Skin: Negative.   Neurological: Negative.   Psychiatric/Behavioral: Negative.   All other systems reviewed and are negative.     Objective:    Physical Exam Vitals and nursing note reviewed.  Constitutional:      General: She is not in acute distress.    Appearance: Normal appearance. She is normal weight. She is not ill-appearing, toxic-appearing or diaphoretic.  Cardiovascular:     Rate and Rhythm: Normal rate and regular rhythm.     Heart sounds: Normal heart sounds. No murmur heard. No friction rub. No gallop.   Pulmonary:     Effort: Pulmonary effort is normal. No respiratory distress.     Breath sounds: Normal breath sounds. No stridor. No wheezing, rhonchi or rales.  Chest:     Chest wall: No tenderness.  Skin:    General: Skin is warm and dry.  Neurological:     General: No focal deficit present.     Mental Status: She is alert and oriented to person, place,  and time. Mental status is at baseline.  Psychiatric:        Mood and Affect: Mood normal.        Behavior:  Behavior normal.        Thought Content: Thought content normal.        Judgment: Judgment normal.     BP (!) 145/77   Pulse 83   Temp 98 F (36.7 C) (Temporal)   Resp 18   Ht $R'5\' 4"'fm$  (1.626 m)   Wt 178 lb 6.4 oz (80.9 kg)   SpO2 96%   BMI 30.62 kg/m  Wt Readings from Last 3 Encounters:  08/07/20 178 lb 6.4 oz (80.9 kg)  06/12/20 184 lb 3.2 oz (83.6 kg)  03/17/20 197 lb 9.6 oz (89.6 kg)     There are no preventive care reminders to display for this patient.  There are no preventive care reminders to display for this patient.  Lab Results  Component Value Date   TSH 1.890 12/20/2019   Lab Results  Component Value Date   WBC 10.9 (H) 01/04/2019   HGB 15.3 01/04/2019   HCT 46.6 01/04/2019   MCV 86 01/04/2019   PLT 316 01/04/2019   Lab Results  Component Value Date   NA 141 12/20/2019   K 4.4 12/20/2019   CHLORIDE 104 09/27/2014   CO2 22 12/20/2019   GLUCOSE 95 12/20/2019   BUN 15 12/20/2019   CREATININE 0.76 12/20/2019   BILITOT 0.4 12/20/2019   ALKPHOS 66 12/20/2019   AST 14 12/20/2019   ALT 17 12/20/2019   PROT 6.7 12/20/2019   ALBUMIN 4.3 12/20/2019   CALCIUM 9.7 12/20/2019   ANIONGAP 15 (H) 09/27/2014   EGFR 85 (L) 09/27/2014   Lab Results  Component Value Date   CHOL 236 (H) 12/20/2019   Lab Results  Component Value Date   HDL 45 12/20/2019   Lab Results  Component Value Date   LDLCALC 155 (H) 12/20/2019   Lab Results  Component Value Date   TRIG 197 (H) 12/20/2019   Lab Results  Component Value Date   CHOLHDL 5.2 (H) 12/20/2019   Lab Results  Component Value Date   HGBA1C 5.5 12/20/2019      Assessment & Plan:   Problem List Items Addressed This Visit   None   Visit Diagnoses    Anxiety state    -  Primary   Relevant Medications   hydrOXYzine (ATARAX/VISTARIL) 10 MG tablet      Meds ordered this encounter   Medications  . hydrOXYzine (ATARAX/VISTARIL) 10 MG tablet    Sig: Take 1 tablet (10 mg total) by mouth 3 (three) times daily as needed.    Dispense:  30 tablet    Refill:  0    Order Specific Question:   Supervising Provider    Answer:   Carlota Raspberry, JEFFREY R [[id number]]    Follow-up: No follow-ups on file.   PLAN  Increase sertraline to $RemoveBefor'75mg'LzhJuwBUPXsH$  PO qd  Hydroxyzine $RemoveBefo'10mg'uRkGFEwQQwO$  PO tid PRN for anxiety  Discussed tangle toys and other ways to keep hands busy  Med check in 6 weeks  Return in June for CPE and labs  Patient encouraged to call clinic with any questions, comments, or concerns.  Maximiano Coss, NP

## 2020-08-07 NOTE — Patient Instructions (Signed)
° ° ° °  If you have lab work done today you will be contacted with your lab results within the next 2 weeks.  If you have not heard from us then please contact us. The fastest way to get your results is to register for My Chart. ° ° °IF you received an x-ray today, you will receive an invoice from [facility]. Please contact [facility] at [phone] with questions or concerns regarding your invoice.  ° °IF you received labwork today, you will receive an invoice from [facility]. Please contact [facility] at [phone] with questions or concerns regarding your invoice.  ° °Our billing staff will not be able to assist you with questions regarding bills from these companies. ° °You will be contacted with the lab results as soon as they are available. The fastest way to get your results is to activate your My Chart account. Instructions are located on the last page of this paperwork. If you have not heard from us regarding the results in 2 weeks, please contact this office. °  ° ° ° °

## 2020-09-14 DIAGNOSIS — R051 Acute cough: Secondary | ICD-10-CM | POA: Diagnosis not present

## 2020-09-14 DIAGNOSIS — I10 Essential (primary) hypertension: Secondary | ICD-10-CM | POA: Diagnosis not present

## 2020-10-27 ENCOUNTER — Other Ambulatory Visit: Payer: Self-pay | Admitting: Registered Nurse

## 2020-10-27 DIAGNOSIS — I10 Essential (primary) hypertension: Secondary | ICD-10-CM

## 2020-10-31 DIAGNOSIS — Z0001 Encounter for general adult medical examination with abnormal findings: Secondary | ICD-10-CM | POA: Diagnosis not present

## 2020-10-31 DIAGNOSIS — F429 Obsessive-compulsive disorder, unspecified: Secondary | ICD-10-CM | POA: Diagnosis not present

## 2020-10-31 DIAGNOSIS — D751 Secondary polycythemia: Secondary | ICD-10-CM | POA: Diagnosis not present

## 2020-10-31 DIAGNOSIS — N809 Endometriosis, unspecified: Secondary | ICD-10-CM | POA: Diagnosis not present

## 2020-10-31 DIAGNOSIS — E559 Vitamin D deficiency, unspecified: Secondary | ICD-10-CM | POA: Diagnosis not present

## 2020-10-31 DIAGNOSIS — I10 Essential (primary) hypertension: Secondary | ICD-10-CM | POA: Diagnosis not present

## 2020-11-07 DIAGNOSIS — I10 Essential (primary) hypertension: Secondary | ICD-10-CM | POA: Diagnosis not present

## 2020-11-07 DIAGNOSIS — D751 Secondary polycythemia: Secondary | ICD-10-CM | POA: Diagnosis not present

## 2020-11-07 DIAGNOSIS — E785 Hyperlipidemia, unspecified: Secondary | ICD-10-CM | POA: Diagnosis not present

## 2020-11-07 DIAGNOSIS — F429 Obsessive-compulsive disorder, unspecified: Secondary | ICD-10-CM | POA: Diagnosis not present

## 2020-11-17 DIAGNOSIS — Z01419 Encounter for gynecological examination (general) (routine) without abnormal findings: Secondary | ICD-10-CM | POA: Diagnosis not present

## 2020-11-17 DIAGNOSIS — Z1231 Encounter for screening mammogram for malignant neoplasm of breast: Secondary | ICD-10-CM | POA: Diagnosis not present

## 2020-11-17 DIAGNOSIS — Z6831 Body mass index (BMI) 31.0-31.9, adult: Secondary | ICD-10-CM | POA: Diagnosis not present

## 2020-12-06 ENCOUNTER — Ambulatory Visit: Payer: Self-pay | Admitting: Registered Nurse

## 2020-12-11 DIAGNOSIS — G4710 Hypersomnia, unspecified: Secondary | ICD-10-CM | POA: Diagnosis not present

## 2020-12-19 DIAGNOSIS — E559 Vitamin D deficiency, unspecified: Secondary | ICD-10-CM | POA: Diagnosis not present

## 2020-12-19 DIAGNOSIS — Z0001 Encounter for general adult medical examination with abnormal findings: Secondary | ICD-10-CM | POA: Diagnosis not present

## 2020-12-27 DIAGNOSIS — I10 Essential (primary) hypertension: Secondary | ICD-10-CM | POA: Diagnosis not present

## 2020-12-27 DIAGNOSIS — D751 Secondary polycythemia: Secondary | ICD-10-CM | POA: Diagnosis not present

## 2020-12-27 DIAGNOSIS — G4733 Obstructive sleep apnea (adult) (pediatric): Secondary | ICD-10-CM | POA: Diagnosis not present

## 2020-12-27 DIAGNOSIS — E785 Hyperlipidemia, unspecified: Secondary | ICD-10-CM | POA: Diagnosis not present

## 2021-01-25 ENCOUNTER — Other Ambulatory Visit: Payer: Self-pay | Admitting: Registered Nurse

## 2021-02-21 DIAGNOSIS — G4733 Obstructive sleep apnea (adult) (pediatric): Secondary | ICD-10-CM | POA: Diagnosis not present

## 2021-02-24 ENCOUNTER — Other Ambulatory Visit: Payer: Self-pay | Admitting: Registered Nurse

## 2021-02-25 ENCOUNTER — Other Ambulatory Visit: Payer: Self-pay | Admitting: Registered Nurse

## 2021-03-01 DIAGNOSIS — G4733 Obstructive sleep apnea (adult) (pediatric): Secondary | ICD-10-CM | POA: Diagnosis not present

## 2021-03-09 DIAGNOSIS — J3489 Other specified disorders of nose and nasal sinuses: Secondary | ICD-10-CM | POA: Diagnosis not present

## 2021-03-09 DIAGNOSIS — H6122 Impacted cerumen, left ear: Secondary | ICD-10-CM | POA: Diagnosis not present

## 2021-03-09 DIAGNOSIS — I10 Essential (primary) hypertension: Secondary | ICD-10-CM | POA: Diagnosis not present

## 2021-03-09 DIAGNOSIS — E785 Hyperlipidemia, unspecified: Secondary | ICD-10-CM | POA: Diagnosis not present

## 2021-03-09 DIAGNOSIS — H6592 Unspecified nonsuppurative otitis media, left ear: Secondary | ICD-10-CM | POA: Diagnosis not present

## 2021-03-09 DIAGNOSIS — G4733 Obstructive sleep apnea (adult) (pediatric): Secondary | ICD-10-CM | POA: Diagnosis not present

## 2021-03-09 DIAGNOSIS — H9202 Otalgia, left ear: Secondary | ICD-10-CM | POA: Diagnosis not present

## 2021-03-09 DIAGNOSIS — D751 Secondary polycythemia: Secondary | ICD-10-CM | POA: Diagnosis not present

## 2021-03-19 DIAGNOSIS — H6993 Unspecified Eustachian tube disorder, bilateral: Secondary | ICD-10-CM | POA: Diagnosis not present

## 2021-03-19 DIAGNOSIS — G4733 Obstructive sleep apnea (adult) (pediatric): Secondary | ICD-10-CM | POA: Diagnosis not present

## 2021-03-19 DIAGNOSIS — H9193 Unspecified hearing loss, bilateral: Secondary | ICD-10-CM | POA: Diagnosis not present

## 2021-03-26 ENCOUNTER — Other Ambulatory Visit: Payer: Self-pay | Admitting: Registered Nurse

## 2021-03-31 DIAGNOSIS — G4733 Obstructive sleep apnea (adult) (pediatric): Secondary | ICD-10-CM | POA: Diagnosis not present

## 2021-04-05 DIAGNOSIS — H401131 Primary open-angle glaucoma, bilateral, mild stage: Secondary | ICD-10-CM | POA: Diagnosis not present

## 2021-04-05 DIAGNOSIS — H2513 Age-related nuclear cataract, bilateral: Secondary | ICD-10-CM | POA: Diagnosis not present

## 2021-04-25 ENCOUNTER — Other Ambulatory Visit: Payer: Self-pay | Admitting: Registered Nurse

## 2021-04-25 DIAGNOSIS — H903 Sensorineural hearing loss, bilateral: Secondary | ICD-10-CM | POA: Diagnosis not present

## 2021-04-25 DIAGNOSIS — I10 Essential (primary) hypertension: Secondary | ICD-10-CM

## 2021-05-01 DIAGNOSIS — G4733 Obstructive sleep apnea (adult) (pediatric): Secondary | ICD-10-CM | POA: Diagnosis not present

## 2021-05-25 ENCOUNTER — Other Ambulatory Visit: Payer: Self-pay | Admitting: Registered Nurse

## 2021-05-25 DIAGNOSIS — I10 Essential (primary) hypertension: Secondary | ICD-10-CM

## 2021-06-04 DIAGNOSIS — Z0001 Encounter for general adult medical examination with abnormal findings: Secondary | ICD-10-CM | POA: Diagnosis not present

## 2021-06-04 DIAGNOSIS — E559 Vitamin D deficiency, unspecified: Secondary | ICD-10-CM | POA: Diagnosis not present

## 2021-06-11 IMAGING — DX DG KNEE COMPLETE 4+V*L*
4 series · 4 of 4 positions shown · non-contrast
Comparison: None.

CLINICAL DATA: Pain

EXAM:
LEFT KNEE - COMPLETE 4+ VIEW

[knee ap]
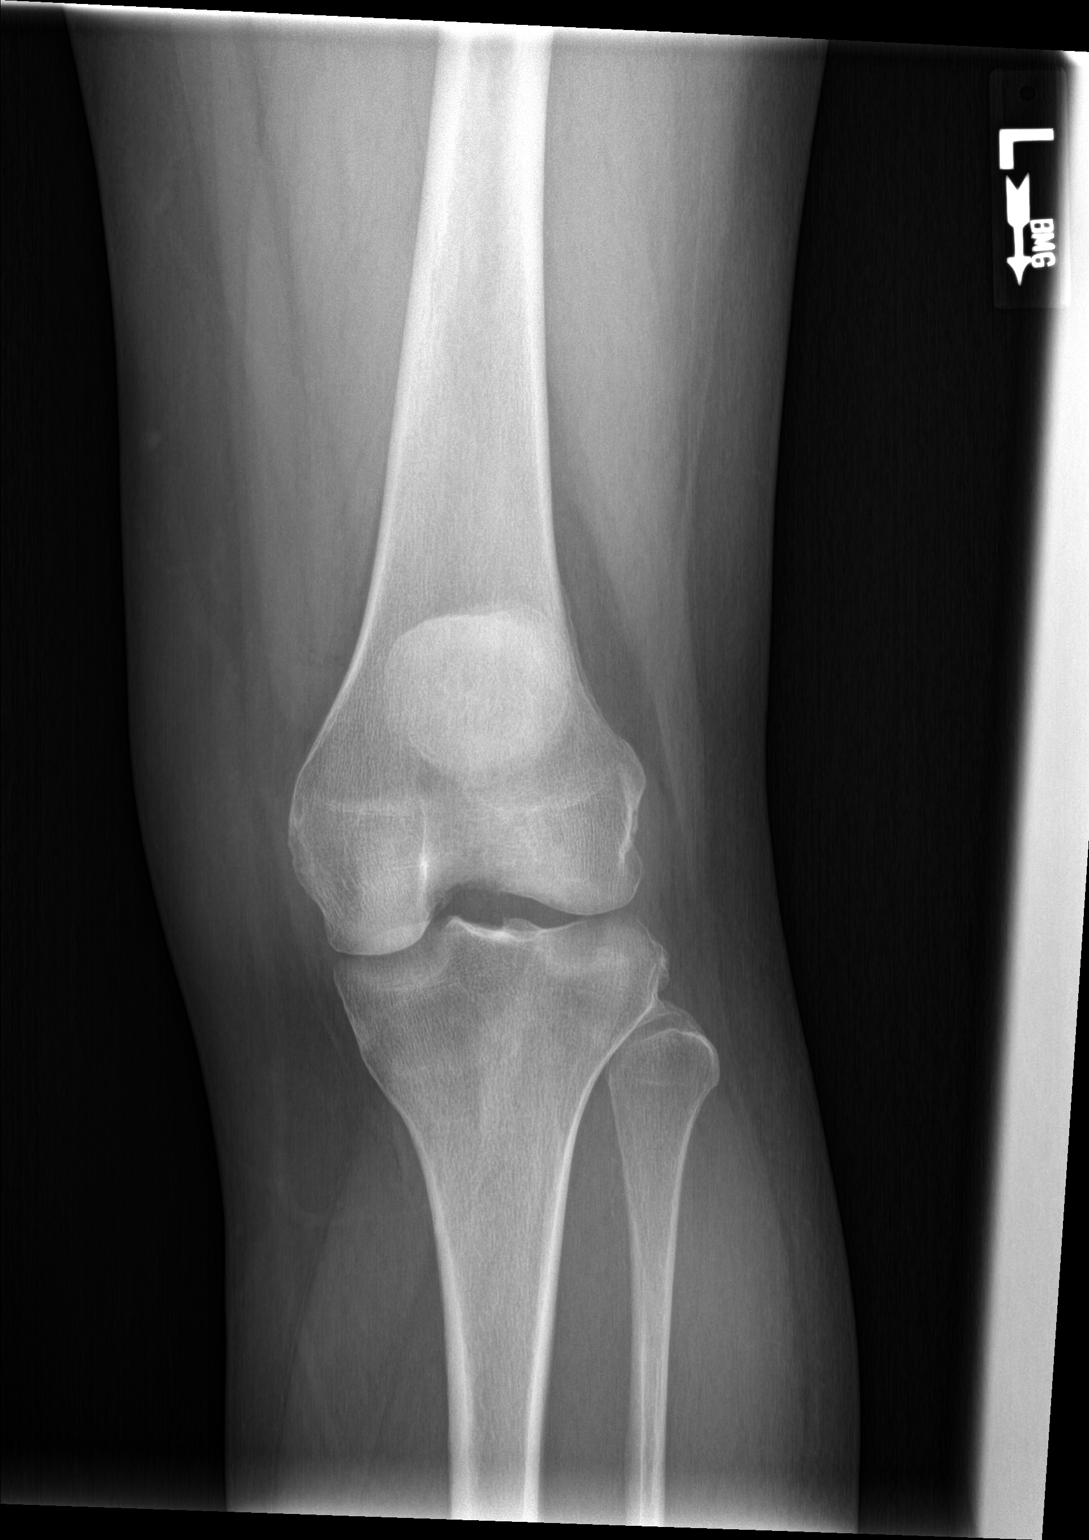

[knee lat]
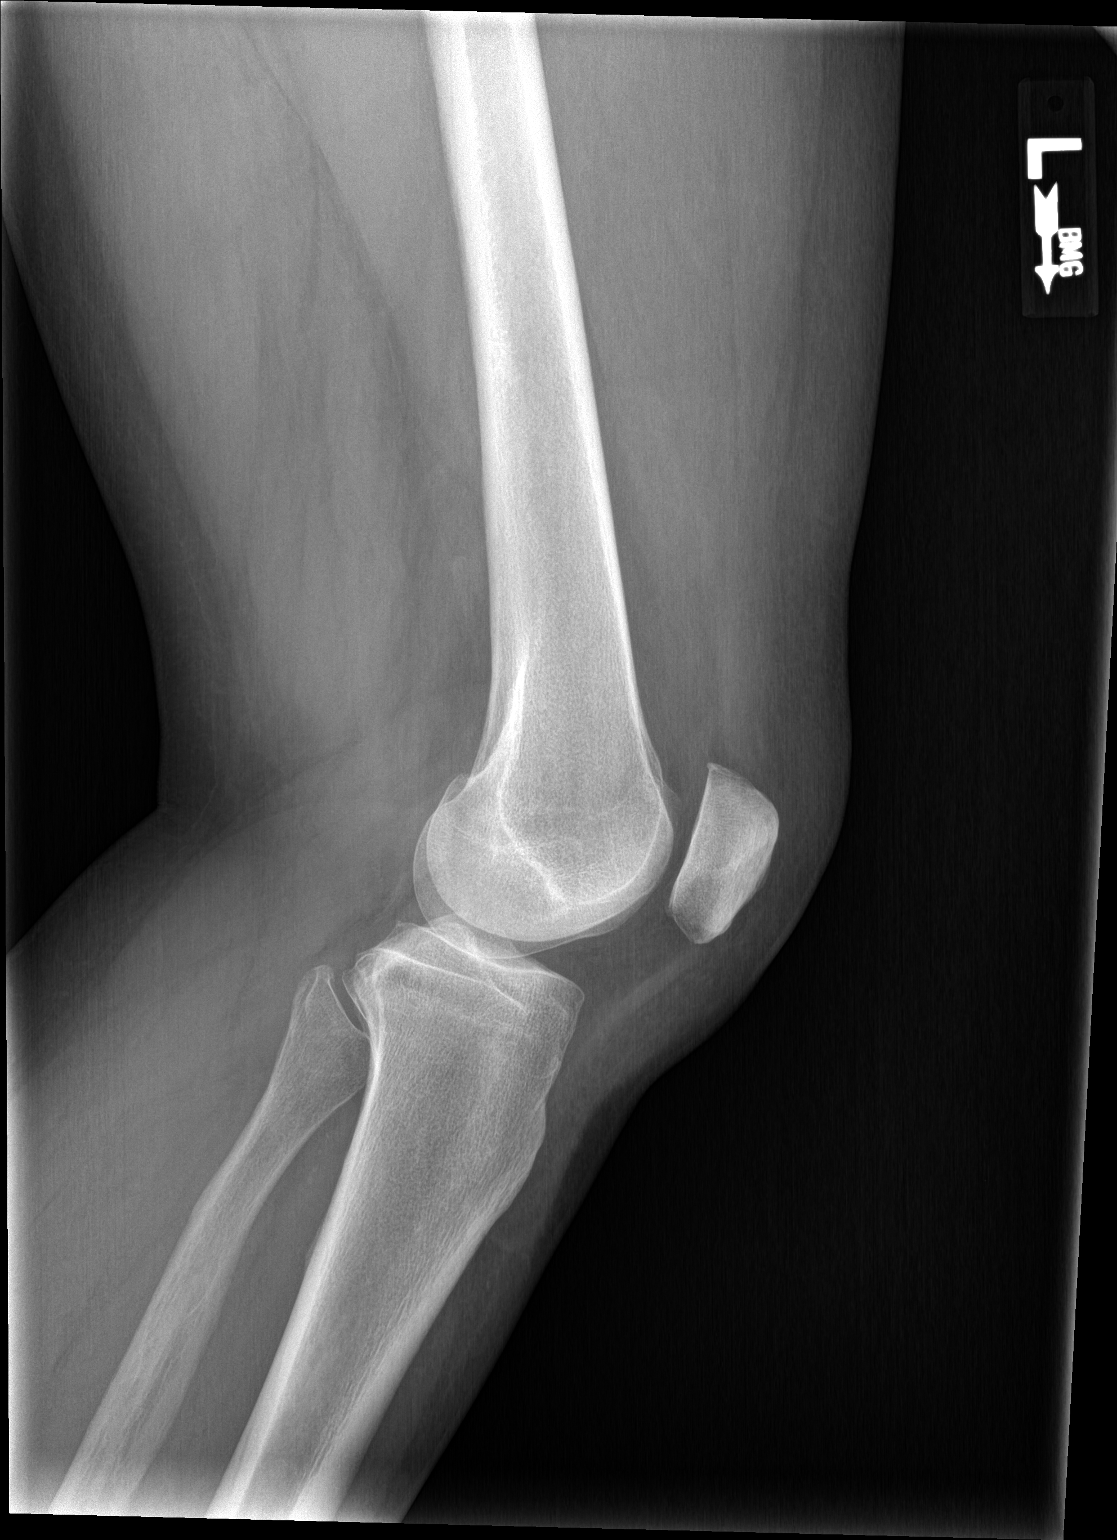

[sunrise]
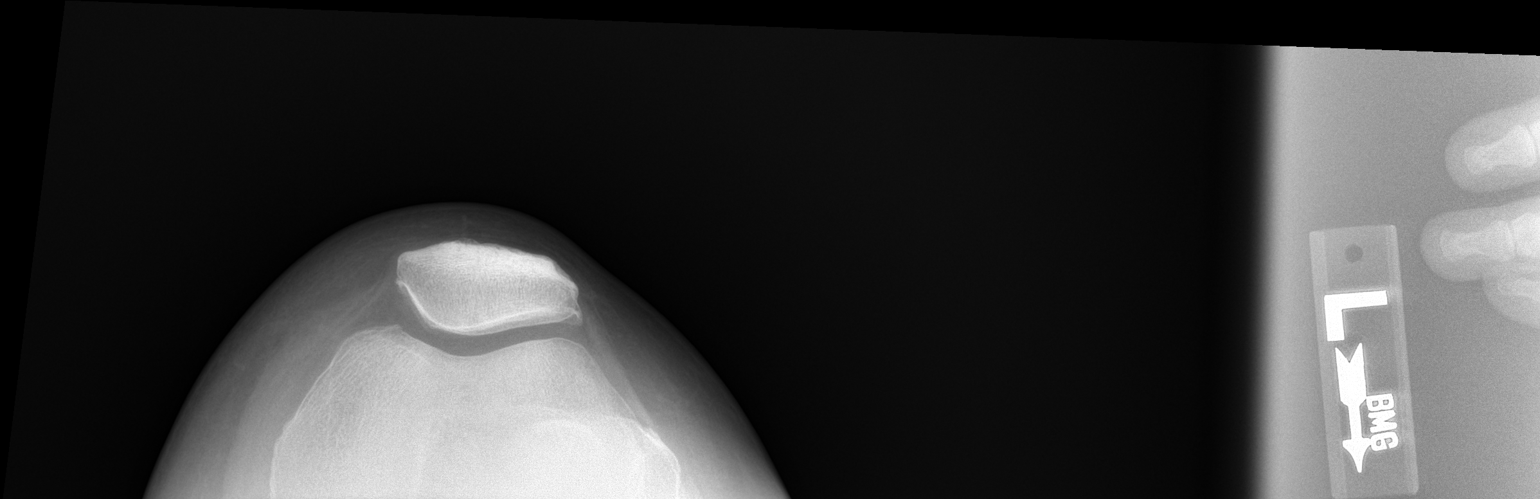

[knee [person_name]]
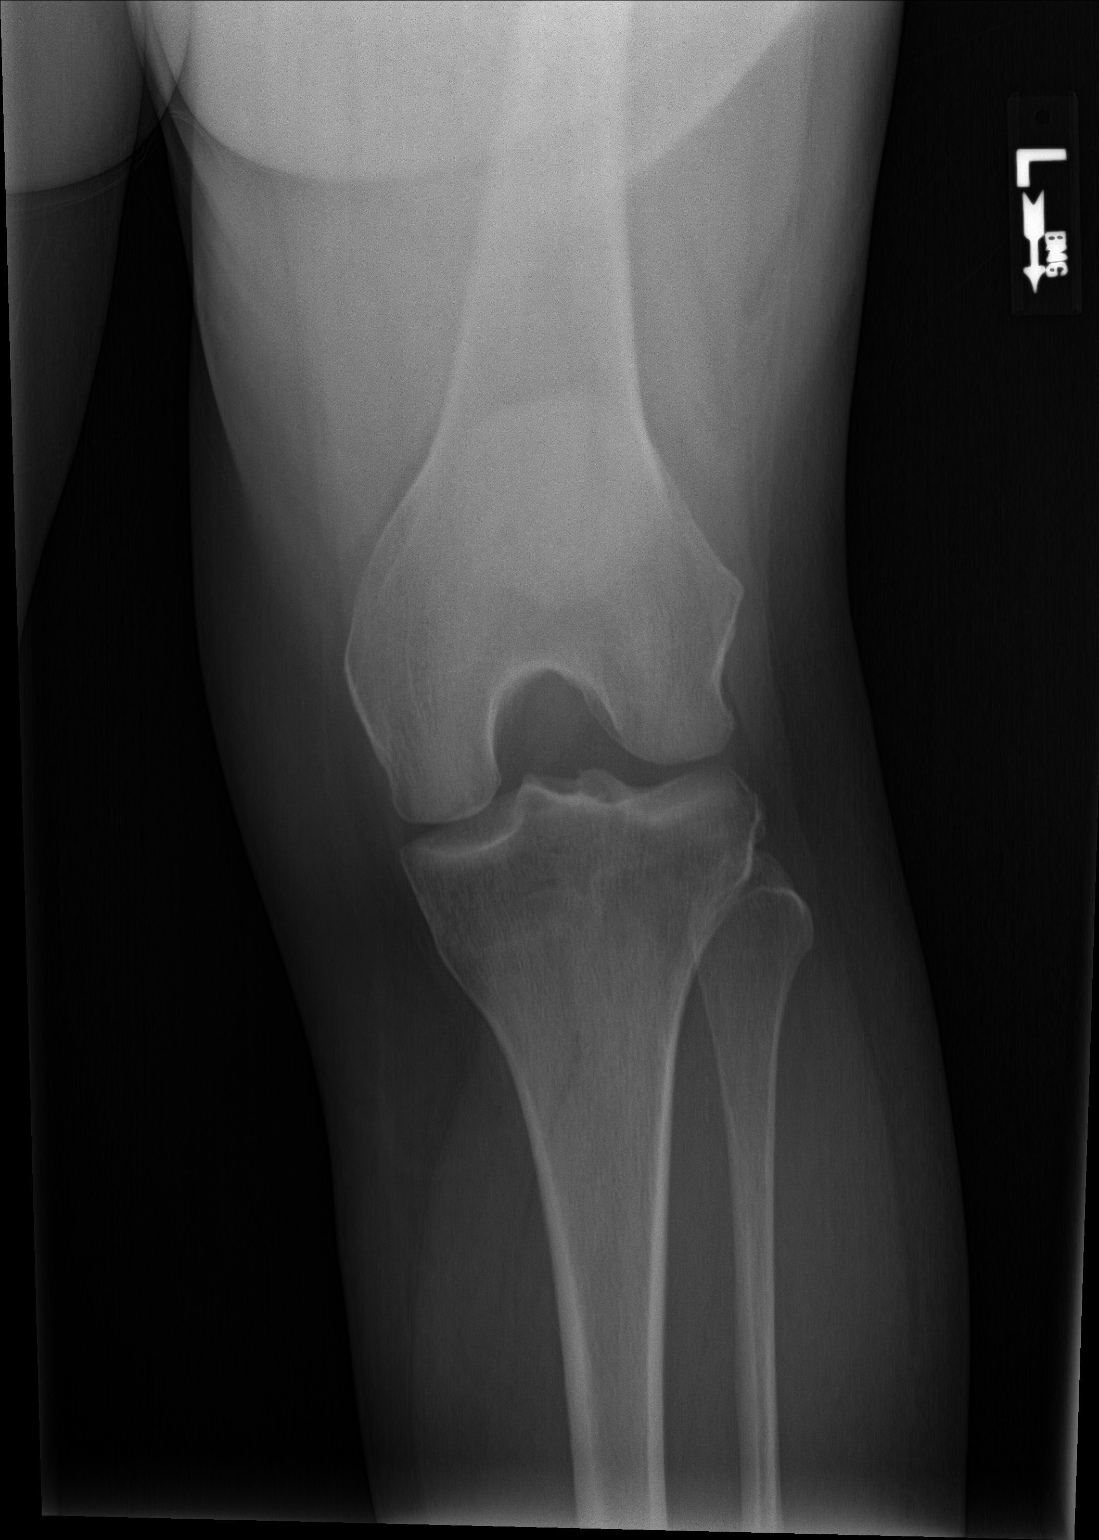

[4 of 4 positions shown; findings below may reference images not displayed]

FINDINGS: Frontal, tunnel, lateral, and sunrise patellar images were obtained.
There is no evident fracture or dislocation. No joint effusion.
Joint spaces appear normal. No erosive change.
IMPRESSION: No evident fracture, dislocation, or joint effusion. No evident
arthropathy.

## 2021-06-13 DIAGNOSIS — M25512 Pain in left shoulder: Secondary | ICD-10-CM | POA: Diagnosis not present

## 2021-06-15 DIAGNOSIS — M25512 Pain in left shoulder: Secondary | ICD-10-CM | POA: Diagnosis not present

## 2021-06-19 DIAGNOSIS — M25512 Pain in left shoulder: Secondary | ICD-10-CM | POA: Diagnosis not present

## 2021-06-21 DIAGNOSIS — M25512 Pain in left shoulder: Secondary | ICD-10-CM | POA: Diagnosis not present

## 2022-08-21 ENCOUNTER — Encounter: Payer: Self-pay | Admitting: Gastroenterology

## 2024-03-22 ENCOUNTER — Encounter (INDEPENDENT_AMBULATORY_CARE_PROVIDER_SITE_OTHER): Payer: Self-pay

## 2024-06-28 NOTE — Progress Notes (Unsigned)
 " Office: [phone]  /  Fax: [phone]   Initial Visit    Ariana Juarez was seen in clinic today to evaluate for obesity. She is interested in losing weight to improve overall health and reduce the risk of weight related complications. She presents today to review program treatment options, initial physical assessment, and evaluation.     She was referred by: {emreferby:28303}  When asked what else they would like to accomplish? She states: {EMHopetoaccomplish:28304::Adopt a healthier eating pattern and lifestyle,Improve energy levels and physical activity,Improve existing medical conditions,Improve quality of life}  When asked how has your weight affected you? She states: {EMWeightAffected:28305}  Weight history: ***  Highest weight: ***  Some associated conditions: {EMSomeConditions:28306}  Contributing factors: {EMcontributingfactors:28307}  Weight promoting medications identified: {EMWeightpromotingrx:28308}  Prior weight loss attempts: {emweightlossprograms:31590::None}  Current nutrition plan: {EMNutritionplan:28309::None}  Current level of physical activity: {EMcurrentPA:28310::None}  Current or previous pharmacotherapy: {EM previousRx:28311}  Response to medication: {EMResponsetomedication:28312}   Past medical history includes:   Past Medical History:  Diagnosis Date   Allergy    Anemia    Depression    Dysmenorrhea    Endometriosis    GERD (gastroesophageal reflux disease)    Glaucoma    Hyperlipidemia    Hypertension    Shortness of breath    on exertion, ie climbing stairs   Uterus and fallopian tubes present    failed removal of fallopian tube; 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 She was weighed on the bioimpedance scale: There is no height or weight on file to calculate BMI.  Body Fat%:***, Visceral Fat Rating:***, Weight trend over the last 12 months:  {emweighttrend:28333}  General:  Alert, oriented and cooperative. Patient is in no acute distress.  Respiratory: Normal respiratory effort, no problems with respiration noted   Gait: able to ambulate independently  Mental Status: Normal mood and affect. Normal behavior. Normal judgment and thought content.   DIAGNOSTIC DATA REVIEWED:  BMET    Component Value Date/Time   NA 141 12/20/2019 0915   NA 142 09/27/2014 1109   K 4.4 12/20/2019 0915   K 3.4 (L) 09/27/2014 1109   CL 102 12/20/2019 0915   CO2 22 12/20/2019 0915   CO2 24 09/27/2014 1109   GLUCOSE 95 12/20/2019 0915   GLUCOSE 104 (H) 12/26/2015 0941   GLUCOSE 100 09/27/2014 1109   BUN 15 12/20/2019 0915   BUN 12.8 09/27/2014 1109   CREATININE 0.76 12/20/2019 0915   CREATININE 0.88 12/26/2015 0941   CREATININE 0.8 09/27/2014 1109   CALCIUM 9.7 12/20/2019 0915   CALCIUM 9.5 09/27/2014 1109   GFRNONAA 88 12/20/2019 0915   GFRAA 101 12/20/2019 0915   Lab Results  Component Value Date   HGBA1C 5.5 12/20/2019   HGBA1C 5.4 03/17/2014   No results found for: INSULIN CBC    Component Value Date/Time   WBC 10.9 (H) 01/04/2019 1030   WBC 13.2 (H) 03/02/2015 0845   RBC 5.40 (H) 01/04/2019 1030   RBC 5.62 (H) 03/02/2015 0845   HGB 15.3 01/04/2019 1030   HGB 14.9 09/27/2014 1109   HCT 46.6 01/04/2019 1030   HCT 44.1 09/27/2014 1109   PLT 316 01/04/2019 1030   MCV 86 01/04/2019 1030   MCV 84.6 09/27/2014 1109   MCH 28.3 01/04/2019 1030   MCH 28.1 03/02/2015 0845   MCHC 32.8 01/04/2019 1030   MCHC 34.3 03/02/2015 0845   RDW 13.2 01/04/2019 1030  RDW 13.6 09/27/2014 1109   Iron/TIBC/Ferritin/ %Sat    Component Value Date/Time   IRON 79 09/27/2014 1109   TIBC 356 09/27/2014 1109   FERRITIN 29 09/27/2014 1109   IRONPCTSAT 22 09/27/2014 1109   Lipid Panel     Component Value Date/Time   CHOL 236 (H) 12/20/2019 0915   TRIG 197 (H) 12/20/2019 0915   HDL 45 12/20/2019 0915   CHOLHDL 5.2 (H) 12/20/2019 0915    CHOLHDL 5.2 (H) 12/26/2015 0941   VLDL 33 (H) 12/26/2015 0941   LDLCALC 155 (H) 12/20/2019 0915   Hepatic Function Panel     Component Value Date/Time   PROT 6.7 12/20/2019 0915   PROT 7.0 09/27/2014 1109   ALBUMIN 4.3 12/20/2019 0915   ALBUMIN 4.1 09/27/2014 1109   AST 14 12/20/2019 0915   AST 9 09/27/2014 1109   ALT 17 12/20/2019 0915   ALT 14 09/27/2014 1109   ALKPHOS 66 12/20/2019 0915   ALKPHOS 61 09/27/2014 1109   BILITOT 0.4 12/20/2019 0915   BILITOT 0.41 09/27/2014 1109      Component Value Date/Time   TSH 1.890 12/20/2019 0915     Assessment and Plan   Primary hypertension  Hypercholesterolemia  Vitamin D  deficiency  Metabolic syndrome  Polycythemia  Endometriosis  Recurrent major depressive disorder, in partial remission  Obesity (BMI 30.0-34.9)    Assessment and Plan Assessment & Plan         Obesity Treatment / Action Plan:  {EMobesityactionplanscribe:28314::Patient will work on garnering support from family and friends to begin weight loss journey.,Will work on eliminating or reducing the presence of highly palatable, calorie dense foods in the home.,Will complete provided nutritional and psychosocial assessment questionnaire before the next appointment.,Will be scheduled for indirect calorimetry to determine resting energy expenditure in a fasting state.  This will allow us  to create a reduced calorie, high-protein meal plan to promote loss of fat mass while preserving muscle mass.,Counseled on the health benefits of losing 5%-15% of total body weight.,Was counseled on nutritional approaches to weight loss and benefits of reducing processed foods and consuming plant-based foods and high quality protein as part of nutritional weight management.,Was counseled on pharmacotherapy and role as an adjunct in weight management. }  Obesity Education Performed Today:  She was weighed on the bioimpedance scale and results were discussed and  documented in the synopsis.  We discussed obesity as a disease and the importance of a more detailed evaluation of all the factors contributing to the disease.  We discussed the importance of Lennartz term lifestyle changes which include nutrition, exercise and behavioral modifications as well as the importance of customizing this to her specific health and social needs.  We discussed the benefits of reaching a healthier weight to alleviate the symptoms of existing conditions and reduce the risks of the biomechanical, metabolic and psychological effects of obesity.  We reviewed the four pillars of obesity medicine and importance of using a multimodal approach.  We reviewed the basic principles in weight management.   Ariana Juarez appears to be in the action stage of change and states they are ready to start intensive lifestyle modifications and behavioral modifications.  I have spent *** minutes in the care of the patient today including: {NUMBER 1-10:22536} minutes before the visit reviewing and preparing the chart. *** minutes face-to-face {emfacetoface:32598::assessing and reviewing listed medical problems as outlined in obesity care plan,providing nutritional and behavioral counseling on topics outlined in the obesity care plan,independently interpreting test results and goals  of care, as described in assessment and plan,reviewing and discussing biometric information and progress} {NUMBER 1-10:22536} minutes after the visit updating chart and documentation of encounter.  Reviewed by clinician on day of visit: allergies, medications, problem list, medical history, surgical history, family history, social history, and previous encounter notes pertinent to obesity diagnosis.    Walker Sitar,PA-C     "

## 2024-06-29 DIAGNOSIS — Z0289 Encounter for other administrative examinations: Secondary | ICD-10-CM

## 2024-06-30 ENCOUNTER — Encounter (INDEPENDENT_AMBULATORY_CARE_PROVIDER_SITE_OTHER): Payer: Self-pay | Admitting: Physician Assistant

## 2024-06-30 ENCOUNTER — Ambulatory Visit (INDEPENDENT_AMBULATORY_CARE_PROVIDER_SITE_OTHER): Admitting: Physician Assistant

## 2024-06-30 VITALS — BP 102/69 | HR 61 | Temp 98.1°F | Ht 63.5 in | Wt 196.0 lb

## 2024-06-30 DIAGNOSIS — E66811 Obesity, class 1: Secondary | ICD-10-CM

## 2024-06-30 DIAGNOSIS — E559 Vitamin D deficiency, unspecified: Secondary | ICD-10-CM | POA: Diagnosis not present

## 2024-06-30 DIAGNOSIS — F3341 Major depressive disorder, recurrent, in partial remission: Secondary | ICD-10-CM

## 2024-06-30 DIAGNOSIS — N809 Endometriosis, unspecified: Secondary | ICD-10-CM | POA: Diagnosis not present

## 2024-06-30 DIAGNOSIS — E7800 Pure hypercholesterolemia, unspecified: Secondary | ICD-10-CM | POA: Diagnosis not present

## 2024-06-30 DIAGNOSIS — G4733 Obstructive sleep apnea (adult) (pediatric): Secondary | ICD-10-CM

## 2024-06-30 DIAGNOSIS — D751 Secondary polycythemia: Secondary | ICD-10-CM

## 2024-06-30 DIAGNOSIS — I10 Essential (primary) hypertension: Secondary | ICD-10-CM

## 2024-06-30 DIAGNOSIS — Z6834 Body mass index (BMI) 34.0-34.9, adult: Secondary | ICD-10-CM | POA: Diagnosis not present

## 2024-06-30 DIAGNOSIS — R7303 Prediabetes: Secondary | ICD-10-CM

## 2024-06-30 DIAGNOSIS — E8881 Metabolic syndrome: Secondary | ICD-10-CM

## 2024-07-28 ENCOUNTER — Encounter (INDEPENDENT_AMBULATORY_CARE_PROVIDER_SITE_OTHER): Payer: Self-pay | Admitting: Nurse Practitioner

## 2024-07-28 ENCOUNTER — Ambulatory Visit (INDEPENDENT_AMBULATORY_CARE_PROVIDER_SITE_OTHER): Admitting: Nurse Practitioner

## 2024-07-28 VITALS — BP 104/64 | HR 70 | Temp 98.6°F | Ht 63.5 in | Wt 195.0 lb

## 2024-07-28 DIAGNOSIS — Z6834 Body mass index (BMI) 34.0-34.9, adult: Secondary | ICD-10-CM | POA: Diagnosis not present

## 2024-07-28 DIAGNOSIS — R0602 Shortness of breath: Secondary | ICD-10-CM | POA: Insufficient documentation

## 2024-07-28 DIAGNOSIS — Z1331 Encounter for screening for depression: Secondary | ICD-10-CM | POA: Insufficient documentation

## 2024-07-28 DIAGNOSIS — E66811 Obesity, class 1: Secondary | ICD-10-CM

## 2024-07-28 DIAGNOSIS — E559 Vitamin D deficiency, unspecified: Secondary | ICD-10-CM | POA: Diagnosis not present

## 2024-07-28 DIAGNOSIS — R5383 Other fatigue: Secondary | ICD-10-CM | POA: Diagnosis not present

## 2024-07-28 DIAGNOSIS — I10 Essential (primary) hypertension: Secondary | ICD-10-CM

## 2024-07-28 DIAGNOSIS — G4733 Obstructive sleep apnea (adult) (pediatric): Secondary | ICD-10-CM

## 2024-07-28 DIAGNOSIS — E7800 Pure hypercholesterolemia, unspecified: Secondary | ICD-10-CM

## 2024-07-28 DIAGNOSIS — T733XXA Exhaustion due to excessive exertion, initial encounter: Secondary | ICD-10-CM | POA: Insufficient documentation

## 2024-07-28 NOTE — Progress Notes (Signed)
 " [address],  [place], [place] [address]  Office: [phone]  /  Fax: [phone]   Subjective   Initial Visit  Ariana Juarez (MR# [id number]) is a 59 y.o. female who presents for evaluation and treatment of obesity and related comorbidities. Current BMI is Body mass index is 34 kg/m. Ariana Juarez has been struggling with her weight for many years and has been unsuccessful in either losing weight, maintaining weight loss, or reaching her healthy weight goal.  Ariana Juarez is currently in the action stage of change and ready to dedicate time achieving and maintaining a healthier weight. Ariana Juarez is interested in becoming our patient and working on intensive lifestyle modifications including (but not limited to) diet and exercise for weight loss.  Weight history:  Ariana Juarez has experienced a Mcmackin-standing history of weight gain, which she attributes to lifestyle changes over the years. Her weight issues began after marriage, with her highest weight being around 150 pounds. After her divorce, she lost weight through exercise and reached about 120 pounds. However, after moving back home, she stopped exercising and gradually gained weight over the past 15 to 20 years.   She lives with her mom, uncle and brother.  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  Ariana Juarez does have hypertension and her BP's are currently well controlled on Lisinopril  40 mg every day, triamterene /hydrochlorothiazide  37.5/25 mg daily and Amlodipine 5 mg  every day. Denies headaches, chest pain shortness of breath at rest and dizziness  BP Readings from Last 3 Encounters:  07/28/24 104/64  06/30/24 102/69  08/07/20 (!) 145/77   She does have Sleep Apnea and is currently on CPAP and uses sporadically but has been using it more about 5/7 nights.   She does have hypercholesterolemia, currently on Atorvastatin 20 mg every day and denies side effects to medication. Has been out of the medication for a week.  Lab Results   Component Value Date   CHOL 236 (H) 12/20/2019   HDL 45 12/20/2019   LDLCALC 155 (H) 12/20/2019   TRIG 197 (H) 12/20/2019   CHOLHDL 5.2 (H) 12/20/2019       When asked how their weight has affected their life and health, she states: Has affected self-esteem, Contributed to medical problems, Contributed to orthopedic problems or mobility issues, Having fatigue, Having poor endurance, and Has affected mood   When asked what else they would like to accomplish? She states: Adopt a healthier eating pattern and lifestyle, Improve energy levels and physical activity, Improve existing medical conditions, Improve quality of life, Improve appearance, Improve self-confidence, and Lose 60 lbs  She starting to note weight gain during : adulthood.  Life events associated with weight gain include : marriage.   Other contributing factors: family history of obesity, disruption of circadian rhythm / sleep disordered breathing, consumption of processed foods, moderate to high levels of stress, reduced physical activity, menopause, slow metabolism for age, need for convenience due to lack of time, enticing relationships and enviroment, multiple weight loss attempts in the past, hectic pace of life, and self - critic or all-or-none mindset.  Their highest weight has been:  203 lbs.  Desired weight: 140-150  Previous weight-loss programs : Weight Watchers, [employer], Tracking and Journaling, Balanced Plate / Portion Control, and High Protein.  Their maximum weight loss was:  25-30 lbs.  Their greatest challenge with dieting: meal preparation and cooking.  Current or previous pharmacotherapy: None.  Response to medication: Never tried medications   Nutritional History:  Current  nutrition plan: Low-carb, High-protein, and Portion control / smart choices.  How many times do you eat outside the home: 5-7 per week  How often do they skip meals: does not skip meals  What beverages do they drink:  caffeinated beverages , smoothies, unsweetened tea, and milk.   Use of artificial sweetners : Yes  Food intolerances or dislikes: Greens, cabbage, brussel sprouts.  Food triggers: To help comfort self and When Sad.  Food cravings: comfort food  Do they struggle with excessive hunger or portion control : No    Physical Activity:  Current level of physical activity: Walking 10 minutes, three a week  Barriers to Exercise: no barriers   Past medical history includes:   Past Medical History:  Diagnosis Date   Allergy    Anemia    Anxiety    Back pain    Constipation    Depression    Dysmenorrhea    Endometriosis    GERD (gastroesophageal reflux disease)    Glaucoma    Hyperlipidemia    Hypertension    Joint pain    Pre-diabetes    Shortness of breath    on exertion, ie climbing stairs   Sleep apnea    Uterus and fallopian tubes present    failed removal of fallopian tube; 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    Anthropometrics:  Vitals Temp: 98.6 F (37 C) BP: 104/64 Pulse Rate: 70 SpO2: 100 %   Anthropometric Measurements Height: 5' 3.5 (1.613 m) Weight: 195 lb (88.5 kg) BMI (Calculated): 34 Peak Weight: 203 lb Waist Measurement : 43 inches   Body Composition  Body Fat %: 41.9 % Fat Mass (lbs): 81.8 lbs Muscle Mass (lbs): 107.8 lbs Total Body Water (lbs): 75 lbs Visceral Fat Rating : 12   Other Clinical Data RMR: 1598 Fasting: yes Labs: yes Today's Visit #: 1 Starting Date: 07/28/24    Physical Exam:  General: She is overweight, cooperative, alert, well developed, and in no acute distress. PSYCH: Has normal mood, affect and thought process.   HEENT: EOMI, sclerae are anicteric. Lungs: Normal breathing effort, no conversational  dyspnea. Extremities: No edema.  Neurologic: No gross sensory or motor deficits. No tremors or fasciculations noted.    Diagnostic Data Reviewed  EKG: Normal sinus rhythm, rate 67. No conduction abnormalities, abnormal Q waves or chamber enlargement.  Indirect Calorimeter completed today shows a VO2 of 231 and a REE of 1598.  Her calculated basal metabolic rate is 8432 thus her resting energy expenditure same as calculated.  Depression Screen  Ariana Juarez's PHQ-9 score was: 4.     07/28/2024   10:01 AM  Depression screen PHQ 2/9  Decreased Interest 2  Down, Depressed, Hopeless 1  PHQ - 2 Score 3  Altered sleeping 0  Tired, decreased energy 1  Change in appetite 0  Feeling bad or failure about yourself  0  Trouble concentrating 0  Moving slowly or fidgety/restless 0  Suicidal thoughts 0  PHQ-9 Score 4  Difficult doing work/chores Somewhat difficult    Screening for Sleep Related Breathing Disorders  Ariana Juarez admits to daytime somnolence and admits to waking up still tired. Patient has a  history of symptoms of daytime fatigue, morning fatigue, and hypertension. Ariana Juarez generally gets 7 hours of sleep per night, and states that she has generally restful sleep. Snoring is present. Apneic episodes are not present. Epworth Sleepiness Score is 5.   BMET    Component Value Date/Time   NA 141 12/20/2019 0915   NA 142 09/27/2014 1109   K 4.4 12/20/2019 0915   K 3.4 (L) 09/27/2014 1109   CL 102 12/20/2019 0915   CO2 22 12/20/2019 0915   CO2 24 09/27/2014 1109   GLUCOSE 95 12/20/2019 0915   GLUCOSE 104 (H) 12/26/2015 0941   GLUCOSE 100 09/27/2014 1109   BUN 15 12/20/2019 0915   BUN 12.8 09/27/2014 1109   CREATININE 0.76 12/20/2019 0915   CREATININE 0.88 12/26/2015 0941   CREATININE 0.8 09/27/2014 1109   CALCIUM 9.7 12/20/2019 0915   CALCIUM 9.5 09/27/2014 1109   GFRNONAA 88 12/20/2019 0915   GFRAA 101 12/20/2019 0915   Lab Results  Component Value Date   HGBA1C 5.5 12/20/2019    HGBA1C 5.4 03/17/2014   No results found for: INSULIN CBC    Component Value Date/Time   WBC 10.9 (H) 01/04/2019 1030   WBC 13.2 (H) 03/02/2015 0845   RBC 5.40 (H) 01/04/2019 1030   RBC 5.62 (H) 03/02/2015 0845   HGB 15.3 01/04/2019 1030   HGB 14.9 09/27/2014 1109   HCT 46.6 01/04/2019 1030   HCT 44.1 09/27/2014 1109   PLT 316 01/04/2019 1030   MCV 86 01/04/2019 1030   MCV 84.6 09/27/2014 1109   MCH 28.3 01/04/2019 1030   MCH 28.1 03/02/2015 0845   MCHC 32.8 01/04/2019 1030   MCHC 34.3 03/02/2015 0845   RDW 13.2 01/04/2019 1030   RDW 13.6 09/27/2014 1109   Iron/TIBC/Ferritin/ %Sat    Component Value Date/Time   IRON 79 09/27/2014 1109   TIBC 356 09/27/2014 1109   FERRITIN 29 09/27/2014 1109   IRONPCTSAT 22 09/27/2014 1109   Lipid Panel     Component Value Date/Time   CHOL 236 (H) 12/20/2019 0915   TRIG 197 (H) 12/20/2019 0915   HDL 45 12/20/2019 0915   CHOLHDL 5.2 (H) 12/20/2019 0915   CHOLHDL 5.2 (H) 12/26/2015 0941   VLDL 33 (H) 12/26/2015 0941   LDLCALC 155 (H) 12/20/2019 0915   Hepatic Function Panel     Component Value Date/Time   PROT 6.7 12/20/2019 0915   PROT 7.0 09/27/2014 1109   ALBUMIN 4.3 12/20/2019 0915   ALBUMIN 4.1 09/27/2014 1109   AST 14 12/20/2019 0915   AST 9 09/27/2014 1109   ALT 17 12/20/2019 0915   ALT 14 09/27/2014 1109   ALKPHOS 66 12/20/2019 0915   ALKPHOS 61 09/27/2014 1109   BILITOT 0.4 12/20/2019 0915   BILITOT 0.41 09/27/2014 1109      Component Value Date/Time   TSH 1.890 12/20/2019 0915     Assessment and Plan  Class 1 obesity with serious comorbidity and body mass index (BMI) of 34.0 to 34.9 in adult, unspecified obesity type TREATMENT PLAN FOR OBESITY:  Recommended Dietary Goals  Ariana Juarez is currently in the action stage of change. As such, her goal is to implement medically supervised obesity management plan.  She has agreed to implement: the Category 1 plan - 1000 kcal per day  Behavioral  Intervention  We discussed the following Behavioral Modification Strategies today: increasing lean protein intake to established goals, decreasing simple carbohydrates , increasing vegetables, increasing lower glycemic fruits, increasing fiber  rich foods, avoiding skipping meals, increasing water intake, work on meal planning and preparation, reading food labels , keeping healthy foods at home, identifying sources and decreasing liquid calories, decreasing eating out or consumption of processed foods, and making healthy choices when eating convenient foods, planning for success, and better snacking choices  Additional resources provided today: Handout on healthy eating and balanced plate, Handout on complex carbohydrates and lean sources of protein, Category 1 packet, and Handout principles of weight management  Recommended Physical Activity Goals  Ariana Juarez has been advised to work up to 150 minutes of moderate intensity aerobic activity a week and strengthening exercises 2-3 times per week for cardiovascular health, weight loss maintenance and preservation of muscle mass.   She has agreed to :  Think about enjoyable ways to increase daily physical activity and overcoming barriers to exercise and Increase physical activity in their day and reduce sedentary time (increase NEAT).  Medical Interventions and Pharmacotherapy We will work on building a therapist, art and behavioral strategies. We will discuss the role of pharmacotherapy as an adjunct at subsequent visits.   ASSOCIATED CONDITIONS ADDRESSED TODAY  Other Fatigue  Ariana Juarez does feel that her weight is causing her energy to be lower than it should be. Fatigue may be related to obesity, depression or many other causes. Labs will be ordered, and in the meanwhile, Ariana Juarez will focus on self care including making healthy food choices, increasing physical activity and focusing on stress reduction. - EKG 12 lead  Shortness of  Breath on Exertion Ariana Juarez notes increasing shortness of breath with physical activity and seems to be worsening over time with weight gain. She notes getting out of breath sooner with activity than she used to. This has not gotten worse recently. Ariana Juarez denies shortness of breath at rest or orthopnea.  Primary hypertension Continue Lisinopril  40 mg every day, triamterene /hydrochlorothiazide  37.5/25 mg daily and Amlodipine 5 mg every day Start Category 1 meal plan  and DASH diet Monitor BP and if consistently >140/90 notify PCP If develops headaches, chest pain, shortness of breath or dizziness go to ER Loss of 10-15% body weight can help improve blood pressures  -     CBC with Differential/Platelet -     Comprehensive metabolic panel with GFR   Depression screening       Scored a 4 on PHQ9       Continue Zoloft  100 mg every day and follow regularly with PCP       Continue to monitor symptoms  Hypercholesterolemia Focus on implementing category 1 meal plan, limit saturated fats Loss of 10-15% body weight can improve lipid levels Continue Atorvastatin 20 mg every day Follow with PCP regularly -     Lipid panel  Vitamin D  deficiency If Vit D level remains low will supplement with Ergocalciferol  50000 units once a week for 12 weeks and then recheck level.   -     VITAMIN D  25 Hydroxy (Vit-D Deficiency, Fractures)  OSA on CPAP       Use CPAP 100% of the time       Decreasing body weight by 10-15% can improve AHI   Class 1 obesity with serious comorbidity and body mass index (BMI) of 34.0 to 34.9 in adult, unspecified obesity type See Plan Above -     CBC with Differential/Platelet -     Comprehensive metabolic panel with GFR -     Hemoglobin A1c -     Insulin, random -  Lipid panel -     TSH -     Vitamin B12 -     VITAMIN D  25 Hydroxy (Vit-D Deficiency, Fractures) -     Magnesium    Follow-up  She was informed of the importance of frequent follow-up visits to maximize  her success with intensive lifestyle modifications for her multiple health conditions. She was informed we would discuss her lab results at her next visit unless there is a critical issue that needs to be addressed sooner. Ariana Juarez agreed to keep her next visit at the agreed upon time to discuss these results.  Attestation Statement  This is the patient's intake visit at [facility]. The patient's Health Questionnaire was reviewed at length. Included in the packet: current and past health history, medications, allergies, ROS, gynecologic history (women only), surgical history, family history, social history, weight history, weight loss surgery history (for those that have had weight loss surgery), nutritional evaluation, mood and food questionnaire, PHQ9, Epworth questionnaire, sleep habits questionnaire, patient life and health improvement goals questionnaire. These will all be scanned into the patient's chart under media.   During the visit, I independently reviewed the patient's EKG, previous labs, bioimpedance scale results, and indirect calorimetry results. I used this information to medically tailor a meal plan for the patient that will help her to lose weight and will improve her obesity-related conditions. I performed a medically necessary appropriate examination and/or evaluation. I discussed the assessment and treatment plan with the patient. The patient was provided an opportunity to ask questions and all were answered. The patient agreed with the plan and demonstrated an understanding of the instructions. Labs were ordered at this visit and will be reviewed at the next visit unless critical results need to be addressed immediately. Clinical information was updated and documented in the EMR.   In addition, they received basic education on identification of processed foods and reduction of these, different sources of lean proteins and complex carbohydrates and how to eat balanced by  incorporation of whole foods.  Reviewed by clinician on day of visit: allergies, medications, problem list, medical history, surgical history, family history, social history, and previous encounter notes.  I personally spent a total of 48 minutes in the care of the patient today including preparing to see the patient, getting/reviewing separately obtained history, performing a medically appropriate exam/evaluation, counseling and educating, placing orders, and documenting clinical information in the EHR. Does not include time spent for EKG, indirect calorimetry or depression screening.    Lonell Liverpool ANP-C "

## 2024-07-29 ENCOUNTER — Ambulatory Visit (INDEPENDENT_AMBULATORY_CARE_PROVIDER_SITE_OTHER): Payer: Self-pay | Admitting: Nurse Practitioner

## 2024-07-29 LAB — COMPREHENSIVE METABOLIC PANEL WITH GFR
ALT: 17 [IU]/L (ref 0–32)
AST: 19 [IU]/L (ref 0–40)
Albumin: 4.6 g/dL (ref 3.8–4.9)
Alkaline Phosphatase: 66 [IU]/L (ref 49–135)
BUN/Creatinine Ratio: 18 (ref 12–28)
BUN: 16 mg/dL (ref 8–27)
Bilirubin Total: 0.5 mg/dL (ref 0.0–1.2)
CO2: 22 mmol/L (ref 20–29)
Calcium: 9.5 mg/dL (ref 8.7–10.3)
Chloride: 97 mmol/L (ref 96–106)
Creatinine, Ser: 0.9 mg/dL (ref 0.57–1.00)
Globulin, Total: 2.1 g/dL (ref 1.5–4.5)
Glucose: 99 mg/dL (ref 70–99)
Potassium: 4.3 mmol/L (ref 3.5–5.2)
Sodium: 137 mmol/L (ref 134–144)
Total Protein: 6.7 g/dL (ref 6.0–8.5)
eGFR: 73 mL/min/{1.73_m2}

## 2024-07-29 LAB — CBC WITH DIFFERENTIAL/PLATELET
Basophils Absolute: 0.1 10*3/uL (ref 0.0–0.2)
Basos: 1 %
EOS (ABSOLUTE): 0.1 10*3/uL (ref 0.0–0.4)
Eos: 1 %
Hematocrit: 49.7 % — ABNORMAL HIGH (ref 34.0–46.6)
Hemoglobin: 16.2 g/dL — ABNORMAL HIGH (ref 11.1–15.9)
Immature Grans (Abs): 0 10*3/uL (ref 0.0–0.1)
Immature Granulocytes: 0 %
Lymphocytes Absolute: 2.6 10*3/uL (ref 0.7–3.1)
Lymphs: 29 %
MCH: 28.6 pg (ref 26.6–33.0)
MCHC: 32.6 g/dL (ref 31.5–35.7)
MCV: 88 fL (ref 79–97)
Monocytes Absolute: 0.6 10*3/uL (ref 0.1–0.9)
Monocytes: 7 %
Neutrophils Absolute: 5.6 10*3/uL (ref 1.4–7.0)
Neutrophils: 62 %
Platelets: 315 10*3/uL (ref 150–450)
RBC: 5.66 x10E6/uL — ABNORMAL HIGH (ref 3.77–5.28)
RDW: 13.2 % (ref 11.7–15.4)
WBC: 9 10*3/uL (ref 3.4–10.8)

## 2024-07-29 LAB — LIPID PANEL
Chol/HDL Ratio: 6 ratio — ABNORMAL HIGH (ref 0.0–4.4)
Cholesterol, Total: 286 mg/dL — ABNORMAL HIGH (ref 100–199)
HDL: 48 mg/dL
LDL Chol Calc (NIH): 202 mg/dL — ABNORMAL HIGH (ref 0–99)
Triglycerides: 188 mg/dL — ABNORMAL HIGH (ref 0–149)
VLDL Cholesterol Cal: 36 mg/dL (ref 5–40)

## 2024-07-29 LAB — HEMOGLOBIN A1C
Est. average glucose Bld gHb Est-mCnc: 123 mg/dL
Hgb A1c MFr Bld: 5.9 % — ABNORMAL HIGH (ref 4.8–5.6)

## 2024-07-29 LAB — VITAMIN B12: Vitamin B-12: 773 pg/mL (ref 232–1245)

## 2024-07-29 LAB — INSULIN, RANDOM: INSULIN: 20.6 u[IU]/mL (ref 2.6–24.9)

## 2024-07-29 LAB — VITAMIN D 25 HYDROXY (VIT D DEFICIENCY, FRACTURES): Vit D, 25-Hydroxy: 24.7 ng/mL — ABNORMAL LOW (ref 30.0–100.0)

## 2024-07-29 LAB — MAGNESIUM: Magnesium: 2.2 mg/dL (ref 1.6–2.3)

## 2024-07-29 LAB — TSH: TSH: 1.93 u[IU]/mL (ref 0.450–4.500)

## 2024-08-11 ENCOUNTER — Ambulatory Visit (INDEPENDENT_AMBULATORY_CARE_PROVIDER_SITE_OTHER): Admitting: Nurse Practitioner
